# Patient Record
Sex: Female | Born: 1995 | Race: White | Hispanic: No | Marital: Married | Smoking: Current some day smoker
Health system: Southern US, Community
[De-identification: ages and names within clinical notes are randomized; demographics above are authoritative.]

## PROBLEM LIST (undated history)

## (undated) DIAGNOSIS — N63 Unspecified lump in unspecified breast: Secondary | ICD-10-CM

## (undated) DIAGNOSIS — N61 Mastitis without abscess: Secondary | ICD-10-CM

## (undated) DIAGNOSIS — J45909 Unspecified asthma, uncomplicated: Secondary | ICD-10-CM

## (undated) HISTORY — DX: Mastitis without abscess: N61.0

## (undated) HISTORY — PX: TONSILLECTOMY: SUR1361

## (undated) HISTORY — PX: UPPER GI ENDOSCOPY: SHX6162

## (undated) HISTORY — PX: GASTRIC ROUX-EN-Y: SHX5262

---
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
## 2013-07-18 DIAGNOSIS — N61 Mastitis without abscess: Secondary | ICD-10-CM

## 2013-07-18 HISTORY — DX: Mastitis without abscess: N61.0

## 2016-02-29 ENCOUNTER — Encounter: Payer: Self-pay | Admitting: *Deleted

## 2016-02-29 ENCOUNTER — Encounter (INDEPENDENT_AMBULATORY_CARE_PROVIDER_SITE_OTHER): Payer: Self-pay

## 2016-02-29 ENCOUNTER — Ambulatory Visit: Payer: Self-pay | Attending: Internal Medicine | Admitting: *Deleted

## 2016-02-29 VITALS — BP 124/79 | HR 69 | Temp 98.0°F | Ht 69.29 in | Wt 280.5 lb

## 2016-02-29 DIAGNOSIS — N63 Unspecified lump in unspecified breast: Secondary | ICD-10-CM

## 2016-02-29 NOTE — Progress Notes (Signed)
Subjective:     Patient ID: Sarah Noble, female   DOB: 12/03/1995, 20 y.o.   MRN: [id number]  HPI   Review of Systems     Objective:   Physical Exam  Pulmonary/Chest: Right breast exhibits no inverted nipple, no mass, no nipple discharge, no skin change and no tenderness. Left breast exhibits no inverted nipple, no mass, no nipple discharge and no tenderness.         Assessment:     20 year old White female referred to [facility] by Elane FritzLakasha Carter, FNP for a left breast mass at 9:00.  On clinical breast exam today bilateral breast have diffuse scattered fibroglandular like tissue.  There is no dominant mass, skin changes or lymphadenopathy noted.  The patient does have a family history of breast cancer in her paternal grandmother diagnosed in her 5920's.  Taught self breast awareness.  Patient has been screened for eligibility.  She does not have any insurance, Medicare or Medicaid.  She also meets financial eligibility.  Hand-out given on the Affordable Care Act.    Plan:     Will get ultrasound of the left breast.  Will follow-up per [facility] protocol.

## 2016-02-29 NOTE — Patient Instructions (Signed)
Gave patient hand-out, Women Staying Healthy, Active and Well from [facility], with education on breast health, pap smears, heart and colon health. 

## 2016-03-02 ENCOUNTER — Ambulatory Visit
Admission: RE | Admit: 2016-03-02 | Discharge: 2016-03-02 | Disposition: A | Discharge status: Home / Self Care | Payer: Self-pay | Source: Ambulatory Visit | Attending: Oncology | Admitting: Oncology

## 2016-03-02 DIAGNOSIS — N63 Unspecified lump in unspecified breast: Secondary | ICD-10-CM

## 2016-03-02 HISTORY — DX: Unspecified lump in unspecified breast: N63.0

## 2016-03-07 ENCOUNTER — Encounter: Payer: Self-pay | Admitting: *Deleted

## 2016-03-07 NOTE — Progress Notes (Signed)
Received results of patients ultrasound showing benign findings.  Radiologist had discussed the results with her earlier.  Left patient a message to return my call.  We will have her return at age 20 for screening mammogram unless otherwise indicated by clinical exam.  Notes routed to her PCP.  [facility] to [place].

## 2016-08-18 ENCOUNTER — Encounter: Payer: Self-pay | Admitting: *Deleted

## 2016-08-24 ENCOUNTER — Ambulatory Visit (INDEPENDENT_AMBULATORY_CARE_PROVIDER_SITE_OTHER): Payer: PRIVATE HEALTH INSURANCE | Admitting: General Surgery

## 2016-08-24 ENCOUNTER — Encounter: Payer: Self-pay | Admitting: General Surgery

## 2016-08-24 VITALS — BP 120/80 | HR 92 | Resp 14 | Ht 67.0 in | Wt 319.0 lb

## 2016-08-24 DIAGNOSIS — N644 Mastodynia: Secondary | ICD-10-CM | POA: Diagnosis not present

## 2016-08-24 NOTE — Patient Instructions (Signed)
The patient is aware to call back for any questions or concerns.  

## 2016-08-24 NOTE — Progress Notes (Signed)
Patient ID: Sarah Noble, female   DOB: 07/24/1995, 21 y.o.   MRN: [id number]  Chief Complaint  Patient presents with  . Breast Problem    HPI Sarah Noble is a 21 y.o. female.  who presents for a breast evaluation. She stats she is having itching of the left breast. She states the breast feels "heavy". She states she has noticed this for 2 weeks. She did notice a red spot but it is not as bad as it was. Noted that in August she had a mass in left breast.  History of right breast mastitis (2015).  I have reviewed the history of present illness with the patient.  HPI  Past Medical History:  Diagnosis Date  . Breast mass    palpable 9:00 Lt breast  . Mastitis 2015   right breast    Past Surgical History:  Procedure Laterality Date  . ACHILLES TENDON SURGERY  2012    Family History  Problem Relation Age of Onset  . Breast cancer Paternal Grandmother   . Breast cancer Maternal Grandmother     Social History Social History  Substance Use Topics  . Smoking status: Never Smoker  . Smokeless tobacco: Never Used  . Alcohol use Yes     Comment: occasionally    No Known Allergies  No current outpatient prescriptions on file.   No current facility-administered medications for this visit.     Review of Systems Review of Systems  Constitutional: Negative.   Respiratory: Negative.   Cardiovascular: Negative.     Blood pressure 120/80, pulse 92, resp. rate 14, height 5\' 7"  (1.702 m), weight (!) 319 lb (144.7 kg), last menstrual period 08/18/2016.  Physical Exam Physical Exam  Constitutional: She is oriented to person, place, and time. She appears well-developed and well-nourished.  HENT:  Mouth/Throat: Oropharynx is clear and moist.  Eyes: Conjunctivae are normal. No scleral icterus.  Neck: Neck supple.  Pulmonary/Chest: Right breast exhibits no inverted nipple, no mass, no nipple discharge, no skin change and no tenderness. Left breast exhibits no inverted  nipple, no mass, no nipple discharge, no skin change and no tenderness.  Lymphadenopathy:    She has no cervical adenopathy.    She has no axillary adenopathy.  Neurological: She is alert and oriented to person, place, and time.  Skin: Skin is warm and dry.  Psychiatric: Her behavior is normal.  Left breast shows a few faint reddish spots above and lateral to areola. No skin induration or underlying mass.  Data Reviewed Notes from [facility]  Assessment    Likely minor skin irritation on left breast-this appears to be markedly resolved at present. No cause for concern.     Plan   Pt reassured. Advised to call if there is any recurrence of symptoms The patient is aware to call back for any questions or concerns.      This information has been scribed by Dorathy DaftMarsha Hatch RN, BSN,BC.   Sarah Noble 08/25/2016, 8:16 AM

## 2016-10-03 ENCOUNTER — Ambulatory Visit
Admission: EM | Admit: 2016-10-03 | Discharge: 2016-10-03 | Disposition: A | Discharge status: Home / Self Care | Payer: Self-pay | Attending: Family Medicine | Admitting: Family Medicine

## 2016-10-03 ENCOUNTER — Encounter: Payer: Self-pay | Admitting: Emergency Medicine

## 2016-10-03 DIAGNOSIS — R140 Abdominal distension (gaseous): Secondary | ICD-10-CM

## 2016-10-03 NOTE — ED Triage Notes (Signed)
Patient c/o bloating after eating and abdominal pain for the past 6 months.  Patient denies vomiting.

## 2016-10-03 NOTE — Discharge Instructions (Signed)
Recommend evaluation by gastroenterologist

## 2016-10-06 ENCOUNTER — Ambulatory Visit
Admission: EM | Admit: 2016-10-06 | Discharge: 2016-10-06 | Disposition: A | Discharge status: Home / Self Care | Payer: Self-pay | Attending: Family Medicine | Admitting: Family Medicine

## 2016-10-06 DIAGNOSIS — J301 Allergic rhinitis due to pollen: Secondary | ICD-10-CM

## 2016-10-06 DIAGNOSIS — J029 Acute pharyngitis, unspecified: Secondary | ICD-10-CM

## 2016-10-06 DIAGNOSIS — H109 Unspecified conjunctivitis: Secondary | ICD-10-CM

## 2016-10-06 LAB — RAPID STREP SCREEN (MED CTR MEBANE ONLY): Streptococcus, Group A Screen (Direct): NEGATIVE

## 2016-10-06 MED ORDER — ERYTHROMYCIN 5 MG/GM OP OINT: 1.0000 "application " | TOPICAL_OINTMENT | Freq: Four times a day (QID) | OPHTHALMIC | 0 refills | Status: DC

## 2016-10-06 NOTE — Discharge Instructions (Signed)
Take medication as prescribed. Rest. Drink plenty of fluids. Good hand hygiene.  ° °Follow up with your primary care physician this week as needed. Return to Urgent care for new or worsening concerns.  ° °

## 2016-10-06 NOTE — ED Triage Notes (Signed)
Patient complains of bilateral eye pain and swelling in the corner started yesterday morning. Patient states that she has a sore throat that started last night.

## 2016-10-06 NOTE — ED Provider Notes (Signed)
[facility] ____________________________________________  Time seen: Approximately 3:04 PM  I have reviewed the triage vital signs and the nursing notes.   HISTORY  Chief Complaint Eye Pain (bilateral) and Sore Throat   HPI Sarah Noble is a 21 y.o. female presenting for the complaints of 2 days of bilateral eye redness, irritation with some greenish drainage. Also reports last night she began to have some sore throat and nasal congestion. States rare cough. Denies fevers. Reports continues to eat and drink well. States some watery eyes as well as sneezing sensation. States both eyes are itchy. States she has been intermittently rubbing her eyes but they are itchy. Denies eye pain. Reports some history of seasonal allergies. Reports continues to remain active. States some sick contacts at work. Denies any pain at this time. Denies eye trauma, foreign bodies, foreign body sensation. Denies chemical exposure. Denies pinkeye exposure. Patient reports that she did try a new mascara this past weekend and unsure if that was a trigger as there is a few days delay of complaints. States has stopped using a new mascara this past Sunday.  Denies chest pain, shortness of breath, abdominal pain, dysuria, extremity pain, extremity swelling or rash. Denies recent sickness. Denies recent antibiotic use.   Patient's last menstrual period was 09/14/2016. Denies pregnancy   Past Medical History:  Diagnosis Date  . Breast mass    palpable 9:00 Lt breast  . Mastitis 2015   right breast    There are no active problems to display for this patient.   Past Surgical History:  Procedure Laterality Date  . ACHILLES TENDON SURGERY  2012     No current facility-administered medications for this encounter.   Current Outpatient Prescriptions:  .  erythromycin ophthalmic ointment, Place 1 application into both eyes 4 (four) times daily. For seven days, Disp: 3.5 g, Rfl:  0  Allergies Patient has no known allergies.  Family History  Problem Relation Age of Onset  . Breast cancer Paternal Grandmother   . Breast cancer Maternal Grandmother     Social History Social History  Substance Use Topics  . Smoking status: Never Smoker  . Smokeless tobacco: Never Used  . Alcohol use Yes     Comment: occasionally    Review of Systems Constitutional: No fever/chills Eyes: No visual changes.As above ENT: As above. Cardiovascular: Denies chest pain. Respiratory: Denies shortness of breath. Gastrointestinal: No abdominal pain.  No nausea, no vomiting.  No diarrhea.  No constipation. Genitourinary: Negative for dysuria. Musculoskeletal: Negative for back pain. Skin: Negative for rash. Neurological: Negative for headaches, focal weakness or numbness.  10-point ROS otherwise negative.  ____________________________________________   PHYSICAL EXAM:  VITAL SIGNS: ED Triage Vitals  Enc Vitals Group     BP 10/06/16 1413 132/84     Pulse Rate 10/06/16 1413 98     Resp 10/06/16 1413 18     Temp 10/06/16 1413 97.5 F (36.4 C)     Temp Source 10/06/16 1413 Oral     SpO2 10/06/16 1413 100 %     Weight 10/06/16 1412 280 lb (127 kg)     Height 10/06/16 1412 5\' 7"  (1.702 m)     Head Circumference --      Peak Flow --      Pain Score 10/06/16 1413 6     Pain Loc --      Pain Edu? --      Excl. in GC? --      Visual Acuity  Right Eye Distance: 20/25 (uncorrected) Left Eye Distance: 20/25 (uncorrected) Bilateral Distance: 20/20 (uncorrected)   Constitutional: Alert and oriented. Well appearing and in no acute distress. Eyes: Minimal injection bilateral conjunctivae. No foreign body seen bilaterally. Right eye with scant greenish discharge. Bilateral eyes medial lower eyelid margin mild erythema present, no surrounding erythema, nontender, no fluctuance or induration. PERRL. EOMI. Head: Atraumatic. No sinus tenderness to palpation. No swelling. No  erythema.  Ears: no erythema, normal TMs bilaterally.   Nose:Nasal congestion with clear rhinorrhea  Mouth/Throat: Mucous membranes are moist. No pharyngeal erythema. No tonsillar swelling or exudate. Posterior pharyngeal cobblestoning noted. Neck: No stridor.  No cervical spine tenderness to palpation. Hematological/Lymphatic/Immunilogical: No cervical lymphadenopathy. Cardiovascular: Normal rate, regular rhythm. Grossly normal heart sounds.  Good peripheral circulation. Respiratory: Normal respiratory effort.  No retractions. No wheezes, rales or rhonchi. Good air movement.  Gastrointestinal: Soft and nontender.  Musculoskeletal: Ambulatory with steady gait. No cervical, thoracic or lumbar tenderness to palpation. Neurologic:  Normal speech and language. No gait instability. Skin:  Skin appears warm, dry and intact. No rash noted. Psychiatric: Mood and affect are normal. Speech and behavior are normal.  ___________________________________________   LABS (all labs ordered are listed, but only abnormal results are displayed)  Labs Reviewed  RAPID STREP SCREEN (NOT AT [facility])  CULTURE, GROUP A STREP [facility])     PROCEDURES Procedures     INITIAL IMPRESSION / ASSESSMENT AND PLAN / ED COURSE  Pertinent labs & imaging results that were available during my care of the patient were reviewed by me and considered in my medical decision making (see chart for details).  Well-appearing patient. No acute distress. Quick strep negative, will culture. Denies eye pain or visual changes or trauma. Suspect allergy symptoms, and discussed with patient viral versus allergic versus bacterial conjunctivitis. As some erythema noted with report of bilateral discharge, patient reports she is recently touching the area will start patient on erythromycin ophthalmic ointment. Encouraged good hand hygiene, warm compresses, keeping clean. Also recommended over-the-counter antihistamine. Encourage rest, fluids and  supportive care. Discussed strict follow-up and return parameters.  Discussed follow up with Primary care physician this week. Discussed follow up and return parameters including no resolution or any worsening concerns. Patient verbalized understanding and agreed to plan.   ____________________________________________   FINAL CLINICAL IMPRESSION(S) / ED DIAGNOSES  Final diagnoses:  Conjunctivitis of both eyes, unspecified conjunctivitis type  Hay fever     Discharge Medication List as of 10/06/2016  3:23 PM    START taking these medications   Details  erythromycin ophthalmic ointment Place 1 application into both eyes 4 (four) times daily. For seven days, Starting Thu 10/06/2016, Normal        Note: This dictation was prepared with Dragon dictation along with smaller phrase technology. Any transcriptional errors that result from this process are unintentional.        Renford Dills, NP 10/06/16 1610

## 2016-10-09 LAB — CULTURE, GROUP A STREP (THRC)

## 2016-11-01 ENCOUNTER — Ambulatory Visit: Payer: Self-pay | Admitting: Gastroenterology

## 2016-11-01 ENCOUNTER — Encounter: Payer: Self-pay | Admitting: Gastroenterology

## 2016-12-26 NOTE — ED Provider Notes (Signed)
[facility]    CSN: [id number] Arrival date & time: 10/03/16  1909     History   Chief Complaint Chief Complaint  Patient presents with  . Abdominal Pain    HPI Sarah Noble is a 21 y.o. female.   The history is provided by the patient.  Abdominal Pain  Pain location:  Generalized Pain quality: bloating   Pain radiates to:  Does not radiate Duration:  24 weeks Timing:  Intermittent Progression:  Unchanged Context: eating   Context: not alcohol use, not awakening from sleep, not diet changes, not laxative use, not medication withdrawal, not previous surgeries, not recent illness, not recent sexual activity, not recent travel, not retching, not sick contacts, not suspicious food intake and not trauma   Context comment:  After eating Relieved by:  None tried Ineffective treatments:  None tried Associated symptoms: sore throat   Associated symptoms: no anorexia, no belching, no chest pain, no chills, no constipation, no diarrhea, no dysuria, no fatigue, no fever, no flatus, no hematemesis, no hematochezia, no hematuria, no melena, no nausea, no shortness of breath, no vaginal bleeding, no vaginal discharge and no vomiting   Risk factors: obesity   Risk factors: no alcohol abuse, no aspirin use, not elderly, has not had multiple surgeries, no NSAID use, not pregnant and no recent hospitalization     Past Medical History:  Diagnosis Date  . Breast mass    palpable 9:00 Lt breast  . Mastitis 2015   right breast    There are no active problems to display for this patient.   Past Surgical History:  Procedure Laterality Date  . ACHILLES TENDON SURGERY  2012    OB History    No data available       Home Medications    Prior to Admission medications   Medication Sig Start Date End Date Taking? Authorizing Provider  erythromycin ophthalmic ointment Place 1 application into both eyes 4 (four) times daily. For seven days 10/06/16   Renford Dills, NP      Family History Family History  Problem Relation Age of Onset  . Breast cancer Paternal Grandmother   . Breast cancer Maternal Grandmother     Social History Social History  Substance Use Topics  . Smoking status: Never Smoker  . Smokeless tobacco: Never Used  . Alcohol use Yes     Comment: occasionally     Allergies   Patient has no known allergies.   Review of Systems Review of Systems  Constitutional: Negative for chills, fatigue and fever.  HENT: Positive for sore throat.   Respiratory: Negative for shortness of breath.   Cardiovascular: Negative for chest pain.  Gastrointestinal: Positive for abdominal pain. Negative for anorexia, constipation, diarrhea, flatus, hematemesis, hematochezia, melena, nausea and vomiting.  Genitourinary: Negative for dysuria, hematuria, vaginal bleeding and vaginal discharge.     Physical Exam Triage Vital Signs ED Triage Vitals  Enc Vitals Group     BP 10/03/16 1947 136/66     Pulse Rate 10/03/16 1947 84     Resp 10/03/16 1947 16     Temp 10/03/16 1947 98.3 F (36.8 C)     Temp Source 10/03/16 1947 Oral     SpO2 10/03/16 1947 98 %     Weight 10/03/16 1945 280 lb (127 kg)     Height 10/03/16 1945 5\' 7"  (1.702 m)     Head Circumference --      Peak Flow --  Pain Score 10/03/16 1946 4     Pain Loc --      Pain Edu? --      Excl. in GC? --    No data found.   Updated Vital Signs BP 136/66 (BP Location: Right Arm)   Pulse 84   Temp 98.3 F (36.8 C) (Oral)   Resp 16   Ht 5\' 7"  (1.702 m)   Wt 280 lb (127 kg)   LMP 09/14/2016 (Approximate)   SpO2 98%   BMI 43.85 kg/m   Visual Acuity Right Eye Distance:   Left Eye Distance:   Bilateral Distance:    Right Eye Near:   Left Eye Near:    Bilateral Near:     Physical Exam  Constitutional: She appears well-developed and well-nourished. No distress.  Abdominal: Soft. Bowel sounds are normal. She exhibits no distension and no mass. There is no tenderness. There  is no rebound and no guarding.  Skin: She is not diaphoretic.  Nursing note and vitals reviewed.    UC Treatments / Results  Labs (all labs ordered are listed, but only abnormal results are displayed) Labs Reviewed - No data to display  EKG  EKG Interpretation None       Radiology No results found.  Procedures Procedures (including critical care time)  Medications Ordered in UC Medications - No data to display   Initial Impression / Assessment and Plan / UC Course  I have reviewed the triage vital signs and the nursing notes.  Pertinent labs & imaging results that were available during my care of the patient were reviewed by me and considered in my medical decision making (see chart for details).       Final Clinical Impressions(s) / UC Diagnoses   Final diagnoses:  Postprandial abdominal bloating    New Prescriptions There are no discharge medications for this patient.  1.diagnosis reviewed with patient 2. Recommend supportive treatment with increase fluids 4. Follow-up with pcp for further evaluation or prn if symptoms worsen    Payton Mccallumonty, Jaxton Casale, MD 12/26/16 2047

## 2017-05-29 ENCOUNTER — Encounter: Payer: Self-pay | Admitting: Emergency Medicine

## 2017-05-29 ENCOUNTER — Ambulatory Visit
Admission: EM | Admit: 2017-05-29 | Discharge: 2017-05-29 | Disposition: A | Discharge status: Home / Self Care | Payer: Self-pay | Attending: Family Medicine | Admitting: Family Medicine

## 2017-05-29 ENCOUNTER — Other Ambulatory Visit: Payer: Self-pay

## 2017-05-29 DIAGNOSIS — J45901 Unspecified asthma with (acute) exacerbation: Secondary | ICD-10-CM

## 2017-05-29 DIAGNOSIS — J0100 Acute maxillary sinusitis, unspecified: Secondary | ICD-10-CM

## 2017-05-29 DIAGNOSIS — J029 Acute pharyngitis, unspecified: Secondary | ICD-10-CM

## 2017-05-29 HISTORY — DX: Unspecified asthma, uncomplicated: J45.909

## 2017-05-29 LAB — RAPID STREP SCREEN (MED CTR MEBANE ONLY): Streptococcus, Group A Screen (Direct): NEGATIVE

## 2017-05-29 MED ORDER — ALBUTEROL SULFATE HFA 108 (90 BASE) MCG/ACT IN AERS: 2.0000 | INHALATION_SPRAY | RESPIRATORY_TRACT | 0 refills | Status: AC | PRN

## 2017-05-29 MED ORDER — PREDNISONE 20 MG PO TABS: 40.0000 mg | ORAL_TABLET | Freq: Every day | ORAL | 0 refills | Status: DC

## 2017-05-29 MED ORDER — AMOXICILLIN-POT CLAVULANATE 875-125 MG PO TABS: 1.0000 | ORAL_TABLET | Freq: Two times a day (BID) | ORAL | 0 refills | Status: DC

## 2017-05-29 NOTE — ED Provider Notes (Signed)
[facility] ____________________________________________  Time seen: Approximately 9:50 AM  I have reviewed the triage vital signs and the nursing notes.   HISTORY  Chief Complaint Sore Throat   HPI Sarah Noble is a 21 y.o. female presenting for evaluation of approximately 8 days of runny nose, nasal congestion, sore throat, cough and intermittent wheezing.  Patient reports history of asthma.  States running out of albuterol inhaler.  States symptoms unresolved with over-the-counter Sudafed, cough and congestion agents.  Denies any fevers.  Reports multiple sick contacts at home and at work.  Reports is continues remain active.  States sinus pain and pressure currently mild to moderate.  States thick nasal drainage.  Reports continues to eat and drink well. Denies chest pain, shortness of breath, or rash. Denies recent sickness. Denies recent antibiotic use.   Patient's last menstrual period was 05/02/2017 (approximate).Denies pregnancy.   Past Medical History:  Diagnosis Date  . Asthma   . Breast mass    palpable 9:00 Lt breast  . Mastitis 2015   right breast    There are no active problems to display for this patient.   Past Surgical History:  Procedure Laterality Date  . ACHILLES TENDON SURGERY  2012  . TONSILLECTOMY       No current facility-administered medications for this encounter.   Current Outpatient Medications:  .  norgestimate-ethinyl estradiol (ORTHO-CYCLEN, 28,) 0.25-35 MG-MCG tablet, Take 1 tablet daily by mouth., Disp: , Rfl:  .  albuterol (PROVENTIL HFA;VENTOLIN HFA) 108 (90 Base) MCG/ACT inhaler, Inhale 2 puffs every 4 (four) hours as needed into the lungs., Disp: 1 Inhaler, Rfl: 0 .  amoxicillin-clavulanate (AUGMENTIN) 875-125 MG tablet, Take 1 tablet every 12 (twelve) hours by mouth., Disp: 20 tablet, Rfl: 0 .  predniSONE (DELTASONE) 20 MG tablet, Take 2 tablets (40 mg total) daily by mouth., Disp: 10 tablet, Rfl:  0  Allergies Patient has no known allergies.  Family History  Problem Relation Age of Onset  . Breast cancer Paternal Grandmother   . Breast cancer Maternal Grandmother     Social History Social History   Tobacco Use  . Smoking status: Never Smoker  . Smokeless tobacco: Never Used  Substance Use Topics  . Alcohol use: Yes    Comment: occasionally  . Drug use: No    Review of Systems Constitutional: No fever/chills ENT: Positive sore throat. Cardiovascular: Denies chest pain. Respiratory: Denies shortness of breath. Gastrointestinal: No abdominal pain.  No nausea, no vomiting.   Genitourinary: Negative for dysuria. Skin: Negative for rash. Neurological: Negative for focal weakness or numbness.  ____________________________________________   PHYSICAL EXAM:  VITAL SIGNS: ED Triage Vitals  Enc Vitals Group     BP 05/29/17 0925 (!) 122/58     Pulse Rate 05/29/17 0925 95     Resp 05/29/17 0925 16     Temp 05/29/17 0925 98.5 F (36.9 C)     Temp Source 05/29/17 0925 Oral     SpO2 05/29/17 0925 99 %     Weight 05/29/17 0922 280 lb (127 kg)     Height 05/29/17 0922 5\' 7"  (1.702 m)     Head Circumference --      Peak Flow --      Pain Score 05/29/17 0922 9     Pain Loc --      Pain Edu? --      Excl. in GC? --    Constitutional: Alert and oriented. Well appearing and in no acute distress. Eyes:  Conjunctivae are normal.  Head: Atraumatic. Mild to moderate tenderness to palpation bilateral frontal and maxillary sinuses. No swelling. No erythema.   Ears: no erythema, normal TMs bilaterally.   Nose: nasal congestion with bilateral nasal turbinate erythema and edema.   Mouth/Throat: Mucous membranes are moist.  Oropharynx non-erythematous.No tonsillar swelling or exudate.  Neck: No stridor.  No cervical spine tenderness to palpation. Hematological/Lymphatic/Immunilogical: No cervical lymphadenopathy. Cardiovascular: Normal rate, regular rhythm. Grossly normal heart  sounds.  Good peripheral circulation. Respiratory: Normal respiratory effort.  No retractions. No rales or rhonchi. Minimal scattered wheezing. Dry intermittent cough noted in room.  Speaks in complete sentences.  Good air movement.  Gastrointestinal: Soft and nontender. Musculoskeletal:  No cervical, thoracic or lumbar tenderness to palpation.  Neurologic:  Normal speech and language. No gait instability. Skin:  Skin is warm, dry. Psychiatric: Mood and affect are normal. Speech and behavior are normal.  ___________________________________________   LABS (all labs ordered are listed, but only abnormal results are displayed)  Labs Reviewed  RAPID STREP SCREEN (NOT AT [facility])  CULTURE, GROUP A STREP [facility]   ____________________________________________  PROCEDURES Procedures   INITIAL IMPRESSION / ASSESSMENT AND PLAN / ED COURSE  Pertinent labs & imaging results that were available during my care of the patient were reviewed by me and considered in my medical decision making (see chart for details).  Well appearing patient.  Suspect recent viral upper respiratory infection, concern for secondary sinusitis and asthma exacerbation.  Will treat patient with oral Augmentin, prednisone, albuterol.  Continue home over-the-counter Sudafed as needed.  Encourage rest, fluids and supportive care.Discussed indication, risks and benefits of medications with patient.  Discussed follow up with Primary care physician this week. Discussed follow up and return parameters including no resolution or any worsening concerns. Patient verbalized understanding and agreed to plan.   ____________________________________________   FINAL CLINICAL IMPRESSION(S) / ED DIAGNOSES  Final diagnoses:  Acute maxillary sinusitis, recurrence not specified  Asthma with acute exacerbation, unspecified asthma severity, unspecified whether persistent     ED Discharge Orders        Ordered    predniSONE (DELTASONE) 20  MG tablet  Daily     05/29/17 0946    amoxicillin-clavulanate (AUGMENTIN) 875-125 MG tablet  Every 12 hours     05/29/17 0946    albuterol (PROVENTIL HFA;VENTOLIN HFA) 108 (90 Base) MCG/ACT inhaler  Every 4 hours PRN     05/29/17 0946       Note: This dictation was prepared with Dragon dictation along with smaller phrase technology. Any transcriptional errors that result from this process are unintentional.         Renford DillsMiller, Turrell Severt, NP 05/29/17 [phone]

## 2017-05-29 NOTE — ED Triage Notes (Signed)
Patient c/o sore throat, swelling in her tonsils, sinus congestion and cough for over a week.

## 2017-05-29 NOTE — Discharge Instructions (Signed)
Take medication as prescribed. Rest. Drink plenty of fluids.  ° °Follow up with your primary care physician this week as needed. Return to Urgent care for new or worsening concerns.  ° °

## 2017-06-01 LAB — CULTURE, GROUP A STREP (THRC)

## 2018-09-09 ENCOUNTER — Ambulatory Visit
Admission: EM | Admit: 2018-09-09 | Discharge: 2018-09-09 | Disposition: A | Discharge status: Home / Self Care | Payer: 59 | Attending: Family Medicine | Admitting: Family Medicine

## 2018-09-09 ENCOUNTER — Other Ambulatory Visit: Payer: Self-pay

## 2018-09-09 DIAGNOSIS — L03011 Cellulitis of right finger: Secondary | ICD-10-CM | POA: Diagnosis not present

## 2018-09-09 MED ORDER — DOXYCYCLINE HYCLATE 100 MG PO CAPS: 100.0000 mg | ORAL_CAPSULE | Freq: Two times a day (BID) | ORAL | 0 refills | Status: DC

## 2018-09-09 MED ORDER — MUPIROCIN 2 % EX OINT: 1.0000 "application " | TOPICAL_OINTMENT | Freq: Two times a day (BID) | CUTANEOUS | 0 refills | Status: AC

## 2018-09-09 NOTE — Discharge Instructions (Signed)
Antibiotics as prescribed.  If you fail to improve or worsen, please seek reevaluation.  Take care  Dr. Adriana Simas

## 2018-09-09 NOTE — ED Triage Notes (Signed)
Patient was picking at her right middle finger nail yesterday. States that the entire finger into the hand is now hurting and swollen. States that there was white exudate coming out when she was squeezing it.

## 2018-09-11 NOTE — ED Provider Notes (Signed)
[facility]    CSN: [id number] Arrival date & time: 09/09/18  1158  History   Chief Complaint Chief Complaint  Patient presents with  . Finger Injury   HPI  23 year old female presents with the above complaint.  Patient has been biting her nails.  Patient reports that yesterday she was biting her nails.  Today she developed pain and redness as well as swelling of the right middle finger.  She reports drainage from the nail.  Pain is quite severe.  No reports of fever.  No medications or interventions tried.  No other associated symptoms.  No other complaints.  PMH, Surgical Hx, Family Hx, Social History reviewed and updated as below.  Past Medical History:  Diagnosis Date  . Asthma   . Breast mass    palpable 9:00 Lt breast  . Mastitis 2015   right breast   Past Surgical History:  Procedure Laterality Date  . ACHILLES TENDON SURGERY  2012  . TONSILLECTOMY     OB History   No obstetric history on file.    Home Medications    Prior to Admission medications   Medication Sig Start Date End Date Taking? Authorizing Provider  albuterol (PROVENTIL HFA;VENTOLIN HFA) 108 (90 Base) MCG/ACT inhaler Inhale 2 puffs every 4 (four) hours as needed into the lungs. 05/29/17  Yes Renford Dills, NP  doxycycline (VIBRAMYCIN) 100 MG capsule Take 1 capsule (100 mg total) by mouth 2 (two) times daily. 09/09/18   Tommie Sams, DO  mupirocin ointment (BACTROBAN) 2 % Apply 1 application topically 2 (two) times daily for 7 days. 09/09/18 09/16/18  Tommie Sams, DO    Family History Family History  Problem Relation Age of Onset  . Breast cancer Paternal Grandmother   . Breast cancer Maternal Grandmother     Social History Social History   Tobacco Use  . Smoking status: Never Smoker  . Smokeless tobacco: Never Used  Substance Use Topics  . Alcohol use: Yes    Comment: occasionally  . Drug use: No     Allergies   Patient has no known allergies.   Review of  Systems Review of Systems  Constitutional: Negative.   Skin:       Finger pain, swelling, redness.    Physical Exam Triage Vital Signs ED Triage Vitals  Enc Vitals Group     BP 09/09/18 1249 129/81     Pulse Rate 09/09/18 1249 75     Resp --      Temp 09/09/18 1249 97.6 F (36.4 C)     Temp Source 09/09/18 1249 Oral     SpO2 09/09/18 1249 98 %     Weight 09/09/18 1252 279 lb 15.8 oz (127 kg)     Height 09/09/18 1252 5\' 7"  (1.702 m)     Head Circumference --      Peak Flow --      Pain Score 09/09/18 1252 0     Pain Loc --      Pain Edu? --      Excl. in GC? --    Updated Vital Signs BP 129/81   Pulse 75   Temp 97.6 F (36.4 C) (Oral)   Ht 5\' 7"  (1.702 m)   Wt 127 kg   LMP 09/03/2018   SpO2 98%   BMI 43.85 kg/m   Visual Acuity Right Eye Distance:   Left Eye Distance:   Bilateral Distance:    Right Eye Near:   Left Eye  Near:    Bilateral Near:     Physical Exam Vitals signs and nursing note reviewed.  Constitutional:      General: She is not in acute distress.    Appearance: Normal appearance. She is obese.  HENT:     Head: Normocephalic and atraumatic.  Eyes:     General:        Right eye: No discharge.        Left eye: No discharge.     Conjunctiva/sclera: Conjunctivae normal.  Pulmonary:     Effort: Pulmonary effort is normal. No respiratory distress.  Skin:    Comments: Right middle finger -redness of the distal finger.  Tender to palpation.  No apparent felon.  Neurological:     Mental Status: She is alert.  Psychiatric:        Mood and Affect: Mood normal.        Behavior: Behavior normal.    UC Treatments / Results  Labs (all labs ordered are listed, but only abnormal results are displayed) Labs Reviewed - No data to display  EKG None  Radiology No results found.  Procedures Procedures (including critical care time)  Medications Ordered in UC Medications - No data to display  Initial Impression / Assessment and Plan / UC  Course  I have reviewed the triage vital signs and the nursing notes.  Pertinent labs & imaging results that were available during my care of the patient were reviewed by me and considered in my medical decision making (see chart for details).    23 year old female presents with cellulitis of the right middle finger.  Treating with Bactroban and doxycycline.  Final Clinical Impressions(s) / UC Diagnoses   Final diagnoses:  Cellulitis of right middle finger     Discharge Instructions     Antibiotics as prescribed.  If you fail to improve or worsen, please seek reevaluation.  Take care  Dr. Adriana Simas    ED Prescriptions    Medication Sig Dispense Auth. Provider   doxycycline (VIBRAMYCIN) 100 MG capsule Take 1 capsule (100 mg total) by mouth 2 (two) times daily. 14 capsule Yuya Vanwingerden G, DO   mupirocin ointment (BACTROBAN) 2 % Apply 1 application topically 2 (two) times daily for 7 days. 22 g Tommie Sams, DO     Controlled Substance Prescriptions [place] Controlled Substance Registry consulted? Not Applicable   Tommie Sams, [place] 09/11/18 [phone]

## 2019-02-10 ENCOUNTER — Encounter (HOSPITAL_COMMUNITY): Payer: Self-pay

## 2019-02-10 ENCOUNTER — Ambulatory Visit (HOSPITAL_COMMUNITY)
Admission: EM | Admit: 2019-02-10 | Discharge: 2019-02-10 | Disposition: A | Discharge status: Home / Self Care | Payer: 59

## 2019-02-10 ENCOUNTER — Other Ambulatory Visit: Payer: Self-pay

## 2019-02-10 DIAGNOSIS — S91209A Unspecified open wound of unspecified toe(s) with damage to nail, initial encounter: Secondary | ICD-10-CM

## 2019-02-10 NOTE — ED Triage Notes (Signed)
Pt states she was coming up some stairs and her foot got caught on a step and lifted her toe nail almost all of the way off. Pt states this happened a hour ago. Right foot big toe.

## 2019-02-10 NOTE — ED Provider Notes (Signed)
[facility]    CSN: [id number] Arrival date & time: 02/10/19  1726      History   Chief Complaint Chief Complaint  Patient presents with  . Nail Problem    HPI Sarah Noble is a 23 y.o. female.   23 year old female comes in for right great toenail injury. She was going up stairs earlier today and caught her foot on a step, causing an avulsed toenail. Denies bleeding. Denies numbness/tingling. Denies decrease in ROM. Rinsed toenail prior to arrival.      Past Medical History:  Diagnosis Date  . Asthma   . Breast mass    palpable 9:00 Lt breast  . Mastitis 2015   right breast    There are no active problems to display for this patient.   Past Surgical History:  Procedure Laterality Date  . ACHILLES TENDON SURGERY  2012  . TONSILLECTOMY      OB History   No obstetric history on file.      Home Medications    Prior to Admission medications   Medication Sig Start Date End Date Taking? Authorizing Provider  albuterol (PROVENTIL HFA;VENTOLIN HFA) 108 (90 Base) MCG/ACT inhaler Inhale 2 puffs every 4 (four) hours as needed into the lungs. 05/29/17   Renford DillsMiller, Lindsey, NP  doxycycline (VIBRAMYCIN) 100 MG capsule Take 1 capsule (100 mg total) by mouth 2 (two) times daily. 09/09/18   Tommie Samsook, Jayce G, DO    Family History Family History  Problem Relation Age of Onset  . Breast cancer Paternal Grandmother   . Breast cancer Maternal Grandmother     Social History Social History   Tobacco Use  . Smoking status: Never Smoker  . Smokeless tobacco: Never Used  Substance Use Topics  . Alcohol use: Yes    Comment: occasionally  . Drug use: No     Allergies   Patient has no known allergies.   Review of Systems Review of Systems  Reason unable to perform ROS: See HPI as above.     Physical Exam Triage Vital Signs ED Triage Vitals  Enc Vitals Group     BP 02/10/19 1742 (!) 148/84     Pulse Rate 02/10/19 1742 100     Resp 02/10/19 1742  18     Temp 02/10/19 1742 98.1 F (36.7 C)     Temp Source 02/10/19 1742 Oral     SpO2 02/10/19 1742 100 %     Weight 02/10/19 1739 (!) 339 lb (153.8 kg)     Height --      Head Circumference --      Peak Flow --      Pain Score 02/10/19 1739 2     Pain Loc --      Pain Edu? --      Excl. in GC? --    No data found.  Updated Vital Signs BP (!) 148/84 (BP Location: Right Arm)   Pulse 100   Temp 98.1 F (36.7 C) (Oral)   Resp 18   Wt (!) 339 lb (153.8 kg)   LMP 02/10/2019   SpO2 100%   BMI 53.09 kg/m   Visual Acuity Right Eye Distance:   Left Eye Distance:   Bilateral Distance:    Right Eye Near:   Left Eye Near:    Bilateral Near:     Physical Exam Constitutional:      General: She is not in acute distress.    Appearance: She is well-developed.  She is not diaphoretic.  HENT:     Head: Normocephalic and atraumatic.  Eyes:     Conjunctiva/sclera: Conjunctivae normal.     Pupils: Pupils are equal, round, and reactive to light.  Musculoskeletal:     Comments: Avulsed right great toenail. Proximal nailbed/cuticle intact. Sensation intact. Cap refill to the toe pad <2s.   Skin:    General: Skin is warm and dry.  Neurological:     Mental Status: She is alert and oriented to person, place, and time.      UC Treatments / Results  Labs (all labs ordered are listed, but only abnormal results are displayed) Labs Reviewed - No data to display  EKG   Radiology No results found.  Procedures  Digital block Consent given by patient Risks discussed. Area cleaned with alcohol. Block needle gauge: 27G Block anesthetic: lidocaine 2% without epi Block injection procedure: anatomic landmarks identified, introduced needle, incremental injection, anatomic landmarks palpated, negative aspiration for blood Block outcome: anesthesia achieved.   Procedures (including critical care time)  Medications Ordered in UC Medications - No data to display  Initial Impression  / Assessment and Plan / UC Course  I have reviewed the triage vital signs and the nursing notes.  Pertinent labs & imaging results that were available during my care of the patient were reviewed by me and considered in my medical decision making (see chart for details).    After anesthesia achieved, approximated nail back to nailbed. Wrapped with compression. Wound care instructions discussed. Follow up with podiatrist as needed.  Final Clinical Impressions(s) / UC Diagnoses   Final diagnoses:  Avulsion of toenail, initial encounter    ED Prescriptions    None        Ok Edwards, PA-C 02/10/19 2200

## 2019-02-10 NOTE — Discharge Instructions (Signed)
Continue to wrap toe nail in place and trim toe nail as needed. Follow up with podiatrist as needed.

## 2019-04-02 NOTE — Progress Notes (Signed)
[facility]  [address], [address] [place], [place] [address] Phone: [phone]  Fax: [phone]   Clinic Day:  04/04/2019  Referring physician: Emelda Brothers, *  Chief Complaint: Sarah Noble is a 24 y.o. female with iron deficiency who is referred in consultation by Dr. Emelda Brothers for assessment and management.   HPI: The patient was seen by general surgeon Dr. Andrey Farmer on 03/12/2019. She was diagnosed with iron deficiency.  She is being referred to the hematology clinic to ensure she is optimized prior to an upcoming gastric bypass surgery. Plan is for laparoscopic Roux-en-Y gastric bypass surgery.  Labs on 02/13/2019: hematocrit 39.7, hemoglobin 12.5, MCV 81, platelets 407,000, WBC 12,200. Ferritin was 7 with an iron saturation of 6% and a TIBC of 477.  CMP included a creatinine of 0.7, calcium 9.4, protein 8.4 (6.2-8.1), albumin 4.2, and normal LFTs.  TSH was 1.64.  B1 was 140.5 (normal).  B12 was 208 (low normal) and folic acid 5.9 (> 6.5).  CBC on 10/12/2016 included a hematocrit of 38.4, hemoglobin 12.6, MCV 82, platelets 6000, white count 11,000   Symptomatically, she is doing well. Patient feels extremely fatigued. She feels "sleepy" all the times. She denies sleep apnea. She reports nosebleeds during the summer. She has headaches. She denies any abdominal and urinary symptoms. She has normal bowel movements.    She has maintained a healthy diet since 10/2018. She drinks more water. She decreased her carbohydrate intake and increased the amount of green leafy vegtables. She is eating an iron rich diet.   She has heavy abnormal menstrual cycles. She does not use tampons. She uses extra long pads that she changes 4 times a day. She reports a lot of clotting. She has continuous periods that could last anywhere between 1 to 3 months. She has been on and off birth control x 3 years. She has noticed a mild improvement while on  birth control. She denies any hematuria, melena, or hematochezia.  She has ice pica.   She is on ferrous sulfate 325 mg every other day. She was advised to take oral B-12 1000 mcg daily, and folic acid 1 mg daily in the clinic today.   She has polycystic ovary disease. She recently noticed that she bruises and bleeds easily over the past few years. She had H. pylori in 2019.  She underwent an upper EDG on 02/06/2019. Her GI doctor is at the [facility] with Laurine Blazer, [place].   Past Medical History:  Diagnosis Date  . Asthma   . Breast mass    palpable 9:00 Lt breast  . Mastitis 2015   right breast    Past Surgical History:  Procedure Laterality Date  . ACHILLES TENDON SURGERY  2012  . TONSILLECTOMY    . UPPER GI ENDOSCOPY      Family History  Problem Relation Age of Onset  . Breast cancer Paternal Grandmother   . Breast cancer Maternal Grandmother   . Skin cancer Maternal Grandmother   . Iron deficiency Mother     Social History:  reports that she has never smoked. She has never used smokeless tobacco. She reports current alcohol use. She reports that she does not use drugs. She denies any exposure to radiation or toxins. She is an only child. She works for [employer]. She lives in [place].  The patient is alone today.  Allergies: No Known Allergies  Current Medications: Current Outpatient Medications  Medication Sig Dispense Refill  .  albuterol (PROVENTIL HFA;VENTOLIN HFA) 108 (90 Base) MCG/ACT inhaler Inhale 2 puffs every 4 (four) hours as needed into the lungs. 1 Inhaler 0  . ferrous sulfate 325 (65 FE) MG tablet Take 325 mg by mouth every other day.    . fluticasone (FLONASE) 50 MCG/ACT nasal spray Place 1 spray into both nostrils as needed.     . norgestimate-ethinyl estradiol (ORTHO-CYCLEN) 0.25-35 MG-MCG tablet Take 1 tablet by mouth daily.     . pantoprazole (PROTONIX) 40 MG tablet Take 40 mg by mouth as needed.     . Vitamin D,  Ergocalciferol, (DRISDOL) 1.25 MG (50000 UT) CAPS capsule Take 50,000 Units by mouth every 7 (seven) days.      No current facility-administered medications for this visit.     Review of Systems  Constitutional: Positive for malaise/fatigue. Negative for chills, diaphoresis, fever and weight loss.       Feels "fine".  HENT: Positive for nosebleeds (seasonal). Negative for congestion, ear discharge, ear pain, hearing loss, sinus pain, sore throat and tinnitus.   Eyes: Negative.  Negative for blurred vision and double vision.  Respiratory: Negative.  Negative for cough, hemoptysis, sputum production, shortness of breath and stridor.   Cardiovascular: Negative.  Negative for chest pain, palpitations and leg swelling.  Gastrointestinal: Negative for abdominal pain, blood in stool, constipation, diarrhea, melena, nausea and vomiting.       H. pylori in 2019. Upper EDG (02/06/2019). Normal bowels. Eating well.  Genitourinary: Negative for dysuria, frequency, hematuria and urgency.       PCOS. Heavy menstrual cycles. On birth control.  Musculoskeletal: Negative.  Negative for back pain, joint pain, myalgias and neck pain.  Skin: Negative.  Negative for itching and rash.  Neurological: Positive for headaches. Negative for dizziness, tingling, sensory change, speech change and weakness.  Endo/Heme/Allergies: Bruises/bleeds easily.  Psychiatric/Behavioral: Negative.  Negative for depression and memory loss. The patient is not nervous/anxious and does not have insomnia.   All other systems reviewed and are negative.  Performance status (ECOG): 0-1  Vitals Blood pressure 124/60, pulse 81, temperature 97.8 F (36.6 C), temperature source Oral, resp. rate 16, height 5\' 8"  (1.727 m), weight (!) 346 lb 2 oz (157 kg), SpO2 99 %.  Physical Exam  Constitutional: She is oriented to person, place, and time. She appears well-developed and well-nourished. No distress.  HENT:  Head: Normocephalic and  atraumatic.  Mouth/Throat: Oropharynx is clear and moist. No oropharyngeal exudate.  Long brown hair in a bun.  Eyes: Pupils are equal, round, and reactive to light. Conjunctivae and EOM are normal.  Green eyes.  Neck: Normal range of motion. Neck supple. No JVD present.  Cardiovascular: Normal rate, regular rhythm and normal heart sounds.  No murmur heard. Pulmonary/Chest: Effort normal and breath sounds normal. No respiratory distress. She has no wheezes. She has no rales.  Abdominal: Soft. Bowel sounds are normal. She exhibits no distension and no mass. There is no abdominal tenderness. There is no rebound and no guarding.  Fully round with no appreciable hepatosplenomegaly.  Musculoskeletal: Normal range of motion.        General: No tenderness or edema.  Lymphadenopathy:    She has no cervical adenopathy.    She has no axillary adenopathy.       Right: No supraclavicular adenopathy present.       Left: No supraclavicular adenopathy present.  Neurological: She is alert and oriented to person, place, and time.  Skin: Skin is warm and dry. No  rash noted. She is not diaphoretic. No erythema. No pallor.  Psychiatric: She has a normal mood and affect. Her behavior is normal. Judgment and thought content normal.  Nursing note and vitals reviewed.   No visits with results within 3 Day(s) from this visit.  Latest known visit with results is:  Admission on 05/29/2017, Discharged on 05/29/2017  Component Date Value Ref Range Status  . Streptococcus, Group A Screen (Dir* 05/29/2017 NEGATIVE  NEGATIVE Final   Comment: (NOTE) A Rapid Antigen test may result negative if the antigen level in the sample is below the detection level of this test. The FDA has not cleared this test as a stand-alone test therefore the rapid antigen negative result has reflexed to a Group A Strep culture.   Marland Kitchen. Specimen Description 05/29/2017 THROAT   Final  . Special Requests 05/29/2017 NONE Reflexed from [id number]    Final  . Culture 05/29/2017    Final                   Value:NO GROUP A STREP (S.PYOGENES) ISOLATED Performed at [facility], [address] [place], [place] [address]   . Report Status 05/29/2017 06/01/2017 FINAL   Final    Assessment:  Sarah Noble is a 23 y.o. female with iron deficiency.  She is being considered for gastric bypass surgery.  Diet has been good since 10/2018.  She notes heavy menses.  She denies any hematuria, melena or hematochezia.  Hemoglobin was 12.5 with a ferritin of 7, iron saturation of 6% and a TIBC of 477 on 02/13/2019.  She is taking oral iron QOD.  She has B12 and folate deficiency.  B12 was 208 (low normal) and folic acid 5.9 (> 6.5) on 02/13/2019.  She underwent EGD on 02/06/2019 (no report available).  She has a history of H pylori.  Symptomatically, she feels "fine".  She often feels sleepy.  She denies sleep apnea.  Plan: 1.   Labs today:  CBC with diff, retic, ferritin, anti-parietal antibodies, intrinsic factor antibodies.  2.   Urinalysis: r/o hematuria. 3.   Iron deficiency  Review labs from 02/13/2019.  She has iron deficiency (ferritin 7 and iron saturation 6%) with a normal hemoglobin.  Iron stores are low.  Etiology is felt secondary to heavy menses.  Discuss increasing oral iron from QOD to daily and possibly BID.  Encourage taking iron with OJ or vitamin C.  Review potential constipation with oral iron.  Preauth Venofer. 4.  B12 and folate deficiency  Review labs from 02/13/2019.  Discuss taking B12 1000 mcg a day and folic acid 1 mg a day.  Discuss rechecking levels in 1 month. 5.   Planned gastric bypass surgery  Discuss issues with iron and B12 absorption.  Discuss likely need for lifelong B12 injections as well as IV iron.  Information provided on IV iron (Venofer). 6.   RTC in 2 weeks for MD assessment (Doximity), labs (CBC, ferritin- day before) to assess tolerance of oral iron and discussion regarding decision  to pursue IV iron.   I discussed the assessment and treatment plan with the patient.  The patient was provided an opportunity to ask questions and all were answered.  The patient agreed with the plan and demonstrated an understanding of the instructions.  The patient was advised to call back if the symptoms worsen or if the condition fails to improve as anticipated.  I provided 27 minutes of face-to-face time during this this encounter  and > 50% was spent counseling as documented under my assessment and plan.    Melissa C. Merlene Pullingorcoran, MD, PhD    04/04/2019, 11:03 AM  I, Theador HawthorneAlexis Patterson, am acting as scribe for Melissa C. Merlene Pullingorcoran, MD, PhD.  I, Melissa C. Merlene Pullingorcoran, MD, have reviewed the above documentation for accuracy and completeness, and I agree with the above.

## 2019-04-03 ENCOUNTER — Other Ambulatory Visit: Payer: Self-pay

## 2019-04-04 ENCOUNTER — Encounter: Payer: Self-pay | Admitting: Hematology and Oncology

## 2019-04-04 ENCOUNTER — Inpatient Hospital Stay: Payer: 59

## 2019-04-04 ENCOUNTER — Inpatient Hospital Stay: Payer: 59 | Attending: Hematology and Oncology | Admitting: Hematology and Oncology

## 2019-04-04 VITALS — BP 124/60 | HR 81 | Temp 97.8°F | Resp 16 | Ht 68.0 in | Wt 346.1 lb

## 2019-04-04 DIAGNOSIS — N920 Excessive and frequent menstruation with regular cycle: Secondary | ICD-10-CM | POA: Diagnosis not present

## 2019-04-04 DIAGNOSIS — Z8619 Personal history of other infectious and parasitic diseases: Secondary | ICD-10-CM | POA: Diagnosis not present

## 2019-04-04 DIAGNOSIS — Z803 Family history of malignant neoplasm of breast: Secondary | ICD-10-CM | POA: Insufficient documentation

## 2019-04-04 DIAGNOSIS — R5383 Other fatigue: Secondary | ICD-10-CM | POA: Insufficient documentation

## 2019-04-04 DIAGNOSIS — E282 Polycystic ovarian syndrome: Secondary | ICD-10-CM | POA: Diagnosis not present

## 2019-04-04 DIAGNOSIS — R51 Headache: Secondary | ICD-10-CM | POA: Diagnosis not present

## 2019-04-04 DIAGNOSIS — Z79899 Other long term (current) drug therapy: Secondary | ICD-10-CM | POA: Insufficient documentation

## 2019-04-04 DIAGNOSIS — D500 Iron deficiency anemia secondary to blood loss (chronic): Secondary | ICD-10-CM

## 2019-04-04 DIAGNOSIS — E538 Deficiency of other specified B group vitamins: Secondary | ICD-10-CM | POA: Insufficient documentation

## 2019-04-04 DIAGNOSIS — Z808 Family history of malignant neoplasm of other organs or systems: Secondary | ICD-10-CM | POA: Insufficient documentation

## 2019-04-04 DIAGNOSIS — D509 Iron deficiency anemia, unspecified: Secondary | ICD-10-CM

## 2019-04-04 DIAGNOSIS — E611 Iron deficiency: Secondary | ICD-10-CM | POA: Insufficient documentation

## 2019-04-04 LAB — URINALYSIS, COMPLETE (UACMP) WITH MICROSCOPIC
Bilirubin Urine: NEGATIVE
Glucose, UA: NEGATIVE mg/dL
Hgb urine dipstick: NEGATIVE
Ketones, ur: NEGATIVE mg/dL
Leukocytes,Ua: NEGATIVE
Nitrite: NEGATIVE
Protein, ur: NEGATIVE mg/dL
RBC / HPF: NONE SEEN RBC/hpf (ref 0–5)
Specific Gravity, Urine: 1.02 (ref 1.005–1.030)
pH: 5.5 (ref 5.0–8.0)

## 2019-04-04 LAB — CBC WITH DIFFERENTIAL/PLATELET
Abs Immature Granulocytes: 0.03 10*3/uL (ref 0.00–0.07)
Basophils Absolute: 0 10*3/uL (ref 0.0–0.1)
Basophils Relative: 0 %
Eosinophils Absolute: 0.2 10*3/uL (ref 0.0–0.5)
Eosinophils Relative: 2 %
HCT: 36.7 % (ref 36.0–46.0)
Hemoglobin: 12 g/dL (ref 12.0–15.0)
Immature Granulocytes: 0 %
Lymphocytes Relative: 25 %
Lymphs Abs: 2.7 10*3/uL (ref 0.7–4.0)
MCH: 26.1 pg (ref 26.0–34.0)
MCHC: 32.7 g/dL (ref 30.0–36.0)
MCV: 79.8 fL — ABNORMAL LOW (ref 80.0–100.0)
Monocytes Absolute: 0.7 10*3/uL (ref 0.1–1.0)
Monocytes Relative: 6 %
Neutro Abs: 7 10*3/uL (ref 1.7–7.7)
Neutrophils Relative %: 67 %
Platelets: 352 10*3/uL (ref 150–400)
RBC: 4.6 MIL/uL (ref 3.87–5.11)
RDW: 15.8 % — ABNORMAL HIGH (ref 11.5–15.5)
WBC: 10.6 10*3/uL — ABNORMAL HIGH (ref 4.0–10.5)
nRBC: 0 % (ref 0.0–0.2)

## 2019-04-04 LAB — RETICULOCYTES
Immature Retic Fract: 21.5 % — ABNORMAL HIGH (ref 2.3–15.9)
RBC.: 4.76 MIL/uL (ref 3.87–5.11)
Retic Count, Absolute: 105.2 10*3/uL (ref 19.0–186.0)
Retic Ct Pct: 2.2 % (ref 0.4–3.1)

## 2019-04-04 LAB — FERRITIN: Ferritin: 11 ng/mL (ref 11–307)

## 2019-04-04 NOTE — Patient Instructions (Signed)
Take B12 1000 micrograms a day.  Take folic acid 1 mg a day.  Try to increase ferrous sulfate 1 pill a day then increase to 1 pill twice a day if tolerated.  Take with orange juice or vitamin C.   Iron Sucrose injection What is this medicine? IRON SUCROSE (AHY ern SOO krohs) is an iron complex. Iron is used to make healthy red blood cells, which carry oxygen and nutrients throughout the body. This medicine is used to treat iron deficiency anemia in people with chronic kidney disease. This medicine may be used for other purposes; ask your health care provider or pharmacist if you have questions. COMMON BRAND NAME(S): Venofer What should I tell my health care provider before I take this medicine? They need to know if you have any of these conditions:  anemia not caused by low iron levels  heart disease  high levels of iron in the blood  kidney disease  liver disease  an unusual or allergic reaction to iron, other medicines, foods, dyes, or preservatives  pregnant or trying to get pregnant  breast-feeding How should I use this medicine? This medicine is for infusion into a vein. It is given by a health care professional in a hospital or clinic setting. Talk to your pediatrician regarding the use of this medicine in children. While this drug may be prescribed for children as young as 2 years for selected conditions, precautions do apply. Overdosage: If you think you have taken too much of this medicine contact a poison control center or emergency room at once. NOTE: This medicine is only for you. Do not share this medicine with others. What if I miss a dose? It is important not to miss your dose. Call your doctor or health care professional if you are unable to keep an appointment. What may interact with this medicine? Do not take this medicine with any of the following medications:  deferoxamine  dimercaprol  other iron products This medicine may also interact with the  following medications:  chloramphenicol  deferasirox This list may not describe all possible interactions. Give your health care provider a list of all the medicines, herbs, non-prescription drugs, or dietary supplements you use. Also tell them if you smoke, drink alcohol, or use illegal drugs. Some items may interact with your medicine. What should I watch for while using this medicine? Visit your doctor or healthcare professional regularly. Tell your doctor or healthcare professional if your symptoms do not start to get better or if they get worse. You may need blood work done while you are taking this medicine. You may need to follow a special diet. Talk to your doctor. Foods that contain iron include: whole grains/cereals, dried fruits, beans, or peas, leafy green vegetables, and organ meats (liver, kidney). What side effects may I notice from receiving this medicine? Side effects that you should report to your doctor or health care professional as soon as possible:  allergic reactions like skin rash, itching or hives, swelling of the face, lips, or tongue  breathing problems  changes in blood pressure  cough  fast, irregular heartbeat  feeling faint or lightheaded, falls  fever or chills  flushing, sweating, or hot feelings  joint or muscle aches/pains  seizures  swelling of the ankles or feet  unusually weak or tired Side effects that usually do not require medical attention (report to your doctor or health care professional if they continue or are bothersome):  diarrhea  feeling achy  headache  irritation at site where injected  nausea, vomiting  stomach upset  tiredness This list may not describe all possible side effects. Call your doctor for medical advice about side effects. You may report side effects to FDA at 1-800-FDA-1088. Where should I keep my medicine? This drug is given in a hospital or clinic and will not be stored at home. NOTE: This sheet is  a summary. It may not cover all possible information. If you have questions about this medicine, talk to your doctor, pharmacist, or health care provider.  2020 Elsevier/Gold Standard (2011-04-14 17:14:35)

## 2019-04-04 NOTE — Progress Notes (Signed)
Pt here as new patient referral from Dr. Andrey Farmer for Iron deficiency. Patient states she is fatigued and reports her mother had issues with iron deficiency as well. States she is currently in the gastric bypass clinic with [facility].

## 2019-04-05 LAB — INTRINSIC FACTOR ANTIBODIES: Intrinsic Factor: 1 AU/mL (ref 0.0–1.1)

## 2019-04-08 LAB — ANTI-PARIETAL ANTIBODY: Parietal Cell Antibody-IgG: 1.9 Units (ref 0.0–20.0)

## 2019-04-11 ENCOUNTER — Encounter: Payer: Self-pay | Admitting: Hematology and Oncology

## 2019-04-16 ENCOUNTER — Other Ambulatory Visit: Payer: Self-pay

## 2019-04-16 DIAGNOSIS — D509 Iron deficiency anemia, unspecified: Secondary | ICD-10-CM

## 2019-04-16 NOTE — Progress Notes (Signed)
[facility]  [address], [address] [place], [place] [address] Phone: [phone]  Fax: [phone]   Telemedicine Office Visit:  04/18/2019  Referring physician: Orene Desanctis, MD  I connected with Sarah Noble on 04/18/2019 at 2:06 PM by videoconferencing and verified that I was speaking with the correct person using 2 identifiers.  The patient was at work.  I discussed the limitations, risk, security and privacy concerns of performing an evaluation and management service by videoconferencing and the availability of in person appointments.  I also discussed with the patient that there may be a patient responsible charge related to this service.  The patient expressed understanding and agreed to proceed.   Chief Complaint: Sarah Noble is a 23 y.o. female with iron deficiency who is seen for 2 week assessment and discussion regarding IV iron.   HPI: The patient was last seen in the hematology oncology clinic on 04/04/2019 for initial consultation.  She was being considered for gastric bypass surgery.  Diet has been good since 10/2018.  She described heavy menses. At that time she felt "fine". She often felt sleepy. She denied sleep apnea. She was on oral iron 325 mg every other day.   Work up included hematocrit 36.7, hemoglobin 12.0, MCV 79.8, platelets 352,000, WBC 10,600. Ferritin was 11. Retic was 2.2%. Anti-parietal cell antibody and intrinsic factor antibody were negative. Urinalysis revealed no hematuria.   She is scheduled for gastric bypass surgery on 05/13/2019.    Labs on 04/17/2019:  Hematocrit 38.6, hemoglobin 12.3, MCV 81.1, platelets 340,000, WBC 10,000. Ferritin was 13.  During the interim, she has done well. She has been taking oral iron 325 mg daily since 04/05/2019.  She notes constipation. She had no further complaints today.    Past Medical History:  Diagnosis Date  . Asthma   . Breast mass    palpable 9:00 Lt breast   . Mastitis 2015   right breast    Past Surgical History:  Procedure Laterality Date  . ACHILLES TENDON SURGERY  2012  . TONSILLECTOMY    . UPPER GI ENDOSCOPY      Family History  Problem Relation Age of Onset  . Breast cancer Paternal Grandmother   . Breast cancer Maternal Grandmother   . Skin cancer Maternal Grandmother   . Iron deficiency Mother     Social History:  reports that she has never smoked. She has never used smokeless tobacco. She reports current alcohol use. She reports that she does not use drugs. She denies any exposure to radiation or toxins. She is an only child. She works for [employer]. She lives in [place].  The patient is alone today.  Participants in the patient's visit and their role in the encounter included the patient, and Bronwen Betters, CMA, today.  The intake visit was provided by Bronwen Betters, CMA.  Allergies: No Known Allergies  Current Medications: Current Outpatient Medications  Medication Sig Dispense Refill  . ferrous sulfate 325 (65 FE) MG tablet Take 325 mg by mouth every other day.    . norgestimate-ethinyl estradiol (ORTHO-CYCLEN) 0.25-35 MG-MCG tablet Take 1 tablet by mouth daily.     . Vitamin D, Ergocalciferol, (DRISDOL) 1.25 MG (50000 UT) CAPS capsule Take 50,000 Units by mouth every 7 (seven) days.     Marland Kitchen albuterol (PROVENTIL HFA;VENTOLIN HFA) 108 (90 Base) MCG/ACT inhaler Inhale 2 puffs every 4 (four) hours as needed into the lungs. (Patient not taking: Reported on 04/18/2019) 1  Inhaler 0  . fluticasone (FLONASE) 50 MCG/ACT nasal spray Place 1 spray into both nostrils as needed.     . pantoprazole (PROTONIX) 40 MG tablet Take 40 mg by mouth as needed.      No current facility-administered medications for this visit.     Review of Systems  Constitutional: Negative for chills, diaphoresis, fever, malaise/fatigue and weight loss.       Feels good.  HENT: Negative.  Negative for congestion, ear discharge,  ear pain, hearing loss, nosebleeds (seasonal), sinus pain, sore throat and tinnitus.   Eyes: Negative.  Negative for blurred vision and double vision.  Respiratory: Negative.  Negative for cough, hemoptysis, sputum production, shortness of breath and stridor.   Cardiovascular: Negative.  Negative for chest pain, palpitations and leg swelling.  Gastrointestinal: Positive for constipation. Negative for abdominal pain, blood in stool, diarrhea, melena, nausea and vomiting.       H. pylori in 2019. Upper EGD on 02/06/2019.   Genitourinary: Negative for dysuria, frequency, hematuria and urgency.       PCOS. Heavy menstrual cycles on birth control.  Musculoskeletal: Negative.  Negative for back pain, joint pain, myalgias and neck pain.  Skin: Negative.  Negative for itching and rash.  Neurological: Negative.  Negative for dizziness, tingling, sensory change, speech change, weakness and headaches.  Endo/Heme/Allergies: Bruises/bleeds easily.  Psychiatric/Behavioral: Negative.  Negative for depression and memory loss. The patient is not nervous/anxious and does not have insomnia.   All other systems reviewed and are negative.   Performance status (ECOG): 0  Physical Exam  Constitutional: She is oriented to person, place, and time. She appears well-developed and well-nourished. No distress.  HENT:  Head: Normocephalic and atraumatic.  Brown hair.  Eyes: Conjunctivae and EOM are normal. No scleral icterus.  Green eyes.  Neurological: She is alert and oriented to person, place, and time.  Skin: She is not diaphoretic.  Psychiatric: She has a normal mood and affect. Her behavior is normal. Judgment and thought content normal.  Nursing note reviewed.   Appointment on 04/17/2019  Component Date Value Ref Range Status  . Ferritin 04/17/2019 13  11 - 307 ng/mL Final   Performed at [facility], [address] [place], [place] [id number]  . WBC 04/17/2019 10.0  4.0 - 10.5 K/uL Final  .  RBC 04/17/2019 4.76  3.87 - 5.11 MIL/uL Final  . Hemoglobin 04/17/2019 12.3  12.0 - 15.0 g/dL Final  . HCT 13/24/401009/30/2020 38.6  36.0 - 46.0 % Final  . MCV 04/17/2019 81.1  80.0 - 100.0 fL Final  . MCH 04/17/2019 25.8* 26.0 - 34.0 pg Final  . MCHC 04/17/2019 31.9  30.0 - 36.0 g/dL Final  . RDW 27/25/366409/30/2020 15.7* 11.5 - 15.5 % Final  . Platelets 04/17/2019 340  150 - 400 K/uL Final  . nRBC 04/17/2019 0.0  0.0 - 0.2 % Final  . Neutrophils Relative % 04/17/2019 62  % Final  . Neutro Abs 04/17/2019 6.1  1.7 - 7.7 K/uL Final  . Lymphocytes Relative 04/17/2019 27  % Final  . Lymphs Abs 04/17/2019 2.7  0.7 - 4.0 K/uL Final  . Monocytes Relative 04/17/2019 8  % Final  . Monocytes Absolute 04/17/2019 0.8  0.1 - 1.0 K/uL Final  . Eosinophils Relative 04/17/2019 3  % Final  . Eosinophils Absolute 04/17/2019 0.3  0.0 - 0.5 K/uL Final  . Basophils Relative 04/17/2019 0  % Final  . Basophils Absolute 04/17/2019 0.0  0.0 - 0.1 K/uL  Final  . Immature Granulocytes 04/17/2019 0  % Final  . Abs Immature Granulocytes 04/17/2019 0.03  0.00 - 0.07 K/uL Final   Performed at [facility], [address], [place], [place] [address]    Assessment:  Sarah Noble is a 23 y.o. female with iron deficiency.  She is being considered for gastric bypass surgery.  Diet has been good since 10/2018.  She notes heavy menses.  She denies any hematuria, melena or hematochezia.  Hemoglobin was 12.5 with a ferritin of 7, iron saturation of 6% and a TIBC of 477 on 02/13/2019.  She is taking oral iron QOD.  Work up on 04/04/2019 revealed hematocrit 36.7, hemoglobin 12.0, MCV 79.8, platelets 352,000, WBC 10,600. Ferritin was 11. Retic was 2.2%. Anti-parietal cell antibody and intrinsic factor antibody were negative. Urinalysis revealed no hematuria.   She has B12 and folate deficiency.  B12 was 208 (low normal) and folic acid 5.9 (> 6.5) on 02/13/2019.  She underwent EGD on 02/06/2019 (no report available).  She  has a history of H pylori.  Symptomatically, she is doing well.  Plan: 1.   Review labs from 04/17/2019.  2.   Iron deficiency             Hematocrit 38.6.  Hemoglobin 12.3.  MCV 81.1.  Ferritin 13.             Goal ferritin 100.  She is currently tolerating oral iron daily.             Review patient has iron deficiency but no anemia.    Re-review issues with iron absorption after gastric bypass surgery.   Review likely need for long-term IV iron.             Patient and her husband have agreed to proceed with gastric bypass surgery.  Preauth Venofer. 3.   B12 and folate deficiency             B12 was 208 and folate 5.9 on 02/03/2019.             Continue B12 1000 mcg a day and folic acid 1 mg a day.             Recheck levels after surgery. 4.    RTC on 06/18/2019 for MD assessment, labs (CBC with diff, ferritin, iron studies, B12, folate- day before), and +/- Venofer.  I discussed the assessment and treatment plan with the patient.  The patient was provided an opportunity to ask questions and all were answered.  The patient agreed with the plan and demonstrated an understanding of the instructions.  The patient was advised to call back or seek an in person evaluation if the symptoms worsen or if the condition fails to improve as anticipated.  I provided 10 minutes (2:06 PM - 2:15 PM) of face-to-face video visit time during this this encounter and > 50% was spent counseling as documented under my assessment and plan.  I provided these services from the [facility] office.   Nolon Stalls, MD, PhD  04/18/2019, 2:06 PM  I, Selena Batten, am acting as scribe for [employer]. Mike Gip, MD, PhD.  I, Melissa C. Mike Gip, MD, have reviewed the above documentation for accuracy and completeness, and I agree with the above.

## 2019-04-17 ENCOUNTER — Inpatient Hospital Stay: Payer: 59

## 2019-04-17 DIAGNOSIS — E611 Iron deficiency: Secondary | ICD-10-CM | POA: Diagnosis not present

## 2019-04-17 DIAGNOSIS — D509 Iron deficiency anemia, unspecified: Secondary | ICD-10-CM

## 2019-04-17 LAB — CBC WITH DIFFERENTIAL/PLATELET
Abs Immature Granulocytes: 0.03 10*3/uL (ref 0.00–0.07)
Basophils Absolute: 0 10*3/uL (ref 0.0–0.1)
Basophils Relative: 0 %
Eosinophils Absolute: 0.3 10*3/uL (ref 0.0–0.5)
Eosinophils Relative: 3 %
HCT: 38.6 % (ref 36.0–46.0)
Hemoglobin: 12.3 g/dL (ref 12.0–15.0)
Immature Granulocytes: 0 %
Lymphocytes Relative: 27 %
Lymphs Abs: 2.7 10*3/uL (ref 0.7–4.0)
MCH: 25.8 pg — ABNORMAL LOW (ref 26.0–34.0)
MCHC: 31.9 g/dL (ref 30.0–36.0)
MCV: 81.1 fL (ref 80.0–100.0)
Monocytes Absolute: 0.8 10*3/uL (ref 0.1–1.0)
Monocytes Relative: 8 %
Neutro Abs: 6.1 10*3/uL (ref 1.7–7.7)
Neutrophils Relative %: 62 %
Platelets: 340 10*3/uL (ref 150–400)
RBC: 4.76 MIL/uL (ref 3.87–5.11)
RDW: 15.7 % — ABNORMAL HIGH (ref 11.5–15.5)
WBC: 10 10*3/uL (ref 4.0–10.5)
nRBC: 0 % (ref 0.0–0.2)

## 2019-04-17 LAB — FERRITIN: Ferritin: 13 ng/mL (ref 11–307)

## 2019-04-18 ENCOUNTER — Ambulatory Visit: Payer: 59

## 2019-04-18 ENCOUNTER — Encounter: Payer: Self-pay | Admitting: Hematology and Oncology

## 2019-04-18 ENCOUNTER — Inpatient Hospital Stay: Payer: 59 | Attending: Hematology and Oncology | Admitting: Hematology and Oncology

## 2019-04-18 DIAGNOSIS — E538 Deficiency of other specified B group vitamins: Secondary | ICD-10-CM

## 2019-04-18 DIAGNOSIS — D509 Iron deficiency anemia, unspecified: Secondary | ICD-10-CM | POA: Diagnosis not present

## 2019-04-18 NOTE — Progress Notes (Signed)
No new changes noted today. The patient name and DOB has been verified by phone today. 

## 2019-06-12 ENCOUNTER — Other Ambulatory Visit: Payer: Self-pay

## 2019-06-12 ENCOUNTER — Inpatient Hospital Stay: Payer: 59 | Attending: Hematology and Oncology

## 2019-06-12 DIAGNOSIS — E538 Deficiency of other specified B group vitamins: Secondary | ICD-10-CM | POA: Diagnosis not present

## 2019-06-12 DIAGNOSIS — Z808 Family history of malignant neoplasm of other organs or systems: Secondary | ICD-10-CM | POA: Diagnosis not present

## 2019-06-12 DIAGNOSIS — Z8619 Personal history of other infectious and parasitic diseases: Secondary | ICD-10-CM | POA: Diagnosis not present

## 2019-06-12 DIAGNOSIS — N920 Excessive and frequent menstruation with regular cycle: Secondary | ICD-10-CM | POA: Insufficient documentation

## 2019-06-12 DIAGNOSIS — K5900 Constipation, unspecified: Secondary | ICD-10-CM | POA: Insufficient documentation

## 2019-06-12 DIAGNOSIS — Z79899 Other long term (current) drug therapy: Secondary | ICD-10-CM | POA: Diagnosis not present

## 2019-06-12 DIAGNOSIS — Z803 Family history of malignant neoplasm of breast: Secondary | ICD-10-CM | POA: Diagnosis not present

## 2019-06-12 DIAGNOSIS — Z832 Family history of diseases of the blood and blood-forming organs and certain disorders involving the immune mechanism: Secondary | ICD-10-CM | POA: Insufficient documentation

## 2019-06-12 DIAGNOSIS — E611 Iron deficiency: Secondary | ICD-10-CM | POA: Diagnosis not present

## 2019-06-12 DIAGNOSIS — R5383 Other fatigue: Secondary | ICD-10-CM | POA: Insufficient documentation

## 2019-06-12 DIAGNOSIS — D509 Iron deficiency anemia, unspecified: Secondary | ICD-10-CM

## 2019-06-12 DIAGNOSIS — Z9884 Bariatric surgery status: Secondary | ICD-10-CM | POA: Diagnosis not present

## 2019-06-12 LAB — CBC WITH DIFFERENTIAL/PLATELET
Abs Immature Granulocytes: 0.03 10*3/uL (ref 0.00–0.07)
Basophils Absolute: 0.1 10*3/uL (ref 0.0–0.1)
Basophils Relative: 0 %
Eosinophils Absolute: 0.2 10*3/uL (ref 0.0–0.5)
Eosinophils Relative: 2 %
HCT: 41.6 % (ref 36.0–46.0)
Hemoglobin: 13.4 g/dL (ref 12.0–15.0)
Immature Granulocytes: 0 %
Lymphocytes Relative: 28 %
Lymphs Abs: 3.4 10*3/uL (ref 0.7–4.0)
MCH: 26.9 pg (ref 26.0–34.0)
MCHC: 32.2 g/dL (ref 30.0–36.0)
MCV: 83.4 fL (ref 80.0–100.0)
Monocytes Absolute: 0.7 10*3/uL (ref 0.1–1.0)
Monocytes Relative: 6 %
Neutro Abs: 7.5 10*3/uL (ref 1.7–7.7)
Neutrophils Relative %: 64 %
Platelets: 384 10*3/uL (ref 150–400)
RBC: 4.99 MIL/uL (ref 3.87–5.11)
RDW: 14.9 % (ref 11.5–15.5)
WBC: 11.9 10*3/uL — ABNORMAL HIGH (ref 4.0–10.5)
nRBC: 0 % (ref 0.0–0.2)

## 2019-06-12 LAB — IRON AND TIBC
Iron: 80 ug/dL (ref 28–170)
Saturation Ratios: 21 % (ref 10.4–31.8)
TIBC: 385 ug/dL (ref 250–450)
UIBC: 305 ug/dL

## 2019-06-12 LAB — FOLATE: Folate: 25 ng/mL (ref >5.9)

## 2019-06-12 LAB — VITAMIN B12: Vitamin B-12: 1594 pg/mL — ABNORMAL HIGH (ref 180–914)

## 2019-06-12 LAB — FERRITIN: Ferritin: 20 ng/mL (ref 11–307)

## 2019-06-16 NOTE — Progress Notes (Signed)
[facility]  [address], [address] [place], [place] [address] Phone: [phone]  Fax: [phone]   Clinic Day:  06/17/2019  Referring physician: Orene DesanctisBehling, Karen, MD  Chief Complaint: Sarah HaroldHeather Griggs Noble is a 23 y.o. female with iron deficiency who is seen for 2 month assessment   HPI: The patient was last encounter with the hematology clinic on 04/18/2019 via telemedicine. At that time, she was doing well. Hematocrit was 38.6, hemoglobin 12.3, and MCV 81.1.  Ferritin was 13.  She was taking oral iron daily.   She continued oral B12 and folic acid.  We discussed her plans for gastric bypass surgery.  She is scheduled for laparoscopic gastric bypass on 07/01/2019.  CBC on 06/12/2019 revealed a hematocrit 41.6, hemoglobin 13.4, MCV 83.4, platelets 384,000, WBC 11900, and ANC 7500. Ferritin was 20 with an iron saturation of 21% and a TIBC of 385.  B12 was 1594 and folate 25.   During the interim, she takes oral iron every other day and is taking oral B-12 and folic acid.  Symptomatically, she feels exhausted all the time. She goes to bed earlier due to having to get up earlier. She denies sleep apnea.    Past Medical History:  Diagnosis Date  . Asthma   . Breast mass    palpable 9:00 Lt breast  . Mastitis 2015   right breast    Past Surgical History:  Procedure Laterality Date  . ACHILLES TENDON SURGERY  2012  . TONSILLECTOMY    . UPPER GI ENDOSCOPY      Family History  Problem Relation Age of Onset  . Breast cancer Paternal Grandmother   . Breast cancer Maternal Grandmother   . Skin cancer Maternal Grandmother   . Iron deficiency Mother     Social History:  reports that she has never smoked. She has never used smokeless tobacco. She reports current alcohol use. She reports that she does not use drugs. She denies any exposure to radiation or toxins. She is an only child.She works for [employer].She [place] in [place]. The  patient is alone today.  Allergies: No Known Allergies  Current Medications: Current Outpatient Medications  Medication Sig Dispense Refill  . albuterol (PROVENTIL HFA;VENTOLIN HFA) 108 (90 Base) MCG/ACT inhaler Inhale 2 puffs every 4 (four) hours as needed into the lungs. 1 Inhaler 0  . ferrous sulfate 325 (65 FE) MG tablet Take 325 mg by mouth every other day.    . norgestimate-ethinyl estradiol (ORTHO-CYCLEN) 0.25-35 MG-MCG tablet Take 1 tablet by mouth daily.     . pantoprazole (PROTONIX) 40 MG tablet Take 40 mg by mouth as needed.     . fluticasone (FLONASE) 50 MCG/ACT nasal spray Place 1 spray into both nostrils as needed.     . gabapentin (NEURONTIN) 300 MG capsule Take by mouth.    . ondansetron (ZOFRAN-ODT) 4 MG disintegrating tablet     . scopolamine (TRANSDERM-SCOP) 1 MG/3DAYS      No current facility-administered medications for this visit.     Review of Systems  Constitutional: Positive for weight loss (5 pounds since 04/04/2019). Negative for chills, diaphoresis, fever and malaise/fatigue.       Feels "good". Exhausted.  HENT: Negative for congestion, ear pain, hearing loss, sinus pain and sore throat.   Eyes: Negative.  Negative for blurred vision and double vision.  Respiratory: Negative.  Negative for cough, hemoptysis, sputum production, shortness of breath and stridor.   Cardiovascular: Negative.  Negative for  chest pain, palpitations and leg swelling.  Gastrointestinal: Positive for constipation. Negative for abdominal pain, blood in stool, diarrhea, melena, nausea and vomiting.       H. pylori in 2019.  Upper EGD on 02/06/2019.   Genitourinary: Negative for dysuria, frequency, hematuria and urgency.       PCOS. Heavy menstrual cycles on birth control.  Musculoskeletal: Negative.  Negative for back pain, joint pain, myalgias and neck pain.  Skin: Negative.  Negative for itching and rash.  Neurological: Negative.  Negative for dizziness, tingling, sensory change,  speech change, focal weakness, seizures, weakness and headaches.  Endo/Heme/Allergies: Bruises/bleeds easily.  Psychiatric/Behavioral: Negative.  Negative for depression and memory loss. The patient is not nervous/anxious and does not have insomnia (sleep more).   All other systems reviewed and are negative.  Performance status (ECOG):  1  Vitals Blood pressure 123/82, pulse 84, temperature (!) 97.5 F (36.4 C), temperature source Tympanic, resp. rate 20, weight (!) 341 lb 0.8 oz (154.7 kg), SpO2 97 %.   Physical Exam  Constitutional: She is oriented to person, place, and time. She appears well-developed and well-nourished. No distress.  HENT:  Head: Normocephalic and atraumatic.  Mouth/Throat: Oropharynx is clear and moist. No oropharyngeal exudate.  Dark brown hair.  Eyes: Pupils are equal, round, and reactive to light. Conjunctivae and EOM are normal. No scleral icterus.  Green eyes.  Neck: Normal range of motion. Neck supple. No JVD present.  Cardiovascular: Normal rate, regular rhythm and normal heart sounds.  Pulmonary/Chest: Effort normal and breath sounds normal. No respiratory distress. She has no wheezes.  Abdominal: Soft. Bowel sounds are normal. She exhibits no distension and no mass. There is no abdominal tenderness. There is no rebound and no guarding.  Musculoskeletal: Normal range of motion.        General: No tenderness or edema.  Lymphadenopathy:       Head (right side): No preauricular, no posterior auricular and no occipital adenopathy present.       Head (left side): No preauricular, no posterior auricular and no occipital adenopathy present.    She has no cervical adenopathy.    She has no axillary adenopathy.       Right: No inguinal and no supraclavicular adenopathy present.       Left: No inguinal and no supraclavicular adenopathy present.  Neurological: She is alert and oriented to person, place, and time.  Skin: Skin is warm and dry. No rash noted. She is  not diaphoretic. No erythema. No pallor.  Psychiatric: She has a normal mood and affect. Her behavior is normal. Judgment and thought content normal.  Nursing note and vitals reviewed.   No visits with results within 3 Day(s) from this visit.  Latest known visit with results is:  Appointment on 06/12/2019  Component Date Value Ref Range Status  . Folate 06/12/2019 25.0  >5.9 ng/mL Final   Performed at [facility], [address] [place] [place], [place] [address]  . Vitamin B-12 06/12/2019 1,594* 180 - 914 pg/mL Final   Comment: (NOTE) This assay is not validated for testing neonatal or myeloproliferative syndrome specimens for Vitamin B12 levels. Performed at [facility], [address] [place], [place] [address]   . Iron 06/12/2019 80  28 - 170 ug/dL Final  . TIBC 62/95/2841 385  250 - 450 ug/dL Final  . Saturation Ratios 06/12/2019 21  10.4 - 31.8 % Final  . UIBC 06/12/2019 305  ug/dL Final   Performed at  [facility], [address], [place], [place] [address]  . Ferritin 06/12/2019 20  11 - 307 ng/mL Final   Performed at [facility], [address] [place] [place], [place] [address]  . WBC 06/12/2019 11.9* 4.0 - 10.5 K/uL Final  . RBC 06/12/2019 4.99  3.87 - 5.11 MIL/uL Final  . Hemoglobin 06/12/2019 13.4  12.0 - 15.0 g/dL Final  . HCT 50/93/2671 41.6  36.0 - 46.0 % Final  . MCV 06/12/2019 83.4  80.0 - 100.0 fL Final  . MCH 06/12/2019 26.9  26.0 - 34.0 pg Final  . MCHC 06/12/2019 32.2  30.0 - 36.0 g/dL Final  . RDW 24/58/0998 14.9  11.5 - 15.5 % Final  . Platelets 06/12/2019 384  150 - 400 K/uL Final  . nRBC 06/12/2019 0.0  0.0 - 0.2 % Final  . Neutrophils Relative % 06/12/2019 64  % Final  . Neutro Abs 06/12/2019 7.5  1.7 - 7.7 K/uL Final  . Lymphocytes Relative 06/12/2019 28  % Final  . Lymphs Abs 06/12/2019 3.4  0.7 - 4.0 K/uL Final  . Monocytes Relative 06/12/2019 6  % Final  . Monocytes Absolute 06/12/2019 0.7  0.1 - 1.0 K/uL Final  .  Eosinophils Relative 06/12/2019 2  % Final  . Eosinophils Absolute 06/12/2019 0.2  0.0 - 0.5 K/uL Final  . Basophils Relative 06/12/2019 0  % Final  . Basophils Absolute 06/12/2019 0.1  0.0 - 0.1 K/uL Final  . Immature Granulocytes 06/12/2019 0  % Final  . Abs Immature Granulocytes 06/12/2019 0.03  0.00 - 0.07 K/uL Final   Performed at [facility], [address] [place], [place] [address]    Assessment:  Cosette Prindle is a 23 y.o. female withiron deficiency. She is being considered for gastric bypass surgery.Diethas been good since 10/2018. She notes heavy menses. She denies any hematuria, melena or hematochezia. Hemoglobin was 12.5 with a ferritinof7,iron saturation of 6% and a TIBC of 477on 02/13/2019.She is taking oral ironQOD.  Work up on 04/04/2019 revealed hematocrit 36.7, hemoglobin 12.0, MCV 79.8, platelets 352,000, WBC 10,600. Ferritin was 11. Retic was 2.2%. Anti-parietal cell antibody and intrinsic factor antibody were negative. Urinalysis revealed no hematuria.   She has B12 and folate deficiency.B12 was 208(low normal)and folic acid 5.9(> 6.5)on 02/13/2019.  B12 was 1594 and folate 25 on 06/12/2019.  She underwent EGDon 02/06/2019 (no report available). She has a history of H pylori.  Symptomatically, she is fatigued.  She denies sleep apnea.  Hemoglobin 13.4.  Plan: 1.   Review labs from 06/12/2019. 2. Iron deficiency Hematocrit 38.6.  Hemoglobin 12.3.  MCV 81.1 on 04/17/2019.              Ferritin 13. Hematocrit 41.6.  Hemoglobin 13.4.  MCV 83.4 on 06/12/2019.              Ferritin 20.  Goal ferritin 100.             She is taking oral iron QOD with water.   Discuss taking with OJ or vitamin C. Review patient has iron deficiency but no anemia.               She will likely require IV iron after gastric bypass surgery.                         Surgery scheduled for 07/01/2019.   Attempt made  to contact her surgeon, Dr Caffie Damme [phone]).  Continue to monitor. 3.   B12 and folate deficiency B12 was 208 and folate 5.9 on 02/03/2019. She is taking B12 1000 mcg a day and folic acid 1 mg a day. B12 was 1594 and folate 25 on 06/12/2019.  Recheck levels after surgery. 4.   RTC approximately 1 month s/p gastric bypass for MD assessment, labs (CBC, ferritin, iron studies- day before), and +/- Venofer.  I discussed the assessment and treatment plan with the patient.  The patient was provided an opportunity to ask questions and all were answered.  The patient agreed with the plan and demonstrated an understanding of the instructions.  The patient was advised to call back if the symptoms worsen or if the condition fails to improve as anticipated.  I provided 14 minutes (3:32 PM - 3:45 PM) of face-to-face time during this this encounter and > 50% was spent counseling as documented under my assessment and plan.    Lequita Asal, MD, PhD    06/17/2019, 3:32 PM  I, Samul Dada, am acting as a scribe for Lequita Asal, MD.  I, [place] Mike Gip, MD, have reviewed the above documentation for accuracy and completeness, and I agree with the above.

## 2019-06-17 ENCOUNTER — Inpatient Hospital Stay: Payer: 59

## 2019-06-17 ENCOUNTER — Ambulatory Visit: Payer: 59 | Admitting: Hematology and Oncology

## 2019-06-17 ENCOUNTER — Other Ambulatory Visit: Payer: 59

## 2019-06-17 ENCOUNTER — Inpatient Hospital Stay (HOSPITAL_BASED_OUTPATIENT_CLINIC_OR_DEPARTMENT_OTHER): Payer: 59 | Admitting: Hematology and Oncology

## 2019-06-17 ENCOUNTER — Other Ambulatory Visit: Payer: Self-pay

## 2019-06-17 VITALS — BP 123/82 | HR 84 | Temp 97.5°F | Resp 20 | Wt 341.1 lb

## 2019-06-17 DIAGNOSIS — E538 Deficiency of other specified B group vitamins: Secondary | ICD-10-CM | POA: Diagnosis not present

## 2019-06-17 DIAGNOSIS — D509 Iron deficiency anemia, unspecified: Secondary | ICD-10-CM | POA: Diagnosis not present

## 2019-06-17 DIAGNOSIS — E611 Iron deficiency: Secondary | ICD-10-CM | POA: Diagnosis not present

## 2019-06-17 NOTE — Progress Notes (Signed)
Patient here for follow up. Denies any concerns.  

## 2019-06-17 NOTE — Progress Notes (Signed)
IV placed in left hand while patient was waiting to see MD. No iron for today so IV removed.

## 2019-12-31 ENCOUNTER — Other Ambulatory Visit: Payer: Self-pay

## 2019-12-31 ENCOUNTER — Ambulatory Visit: Payer: 59 | Admitting: Physician Assistant

## 2019-12-31 ENCOUNTER — Encounter: Payer: Self-pay | Admitting: Physician Assistant

## 2019-12-31 DIAGNOSIS — B373 Candidiasis of vulva and vagina: Secondary | ICD-10-CM

## 2019-12-31 DIAGNOSIS — Z3202 Encounter for pregnancy test, result negative: Secondary | ICD-10-CM

## 2019-12-31 DIAGNOSIS — Z113 Encounter for screening for infections with a predominantly sexual mode of transmission: Secondary | ICD-10-CM

## 2019-12-31 DIAGNOSIS — B3731 Acute candidiasis of vulva and vagina: Secondary | ICD-10-CM

## 2019-12-31 MED ORDER — CLOTRIMAZOLE 1 % VA CREA: 1.0000 | TOPICAL_CREAM | Freq: Every day | VAGINAL | 0 refills | Status: AC

## 2019-12-31 NOTE — Progress Notes (Signed)
UPT is negative today. Wet mount reviewed and pt treated for yeast per provider order and standing order. Counseled pt per provider orders and pt states understanding. Pt declined discussing birth control in clinic today with provider, but upon posting pt requests to be scheduled for a later date for a physical and to possibly start birth control. Pt reports she is unsure, but may be interested in getting an IUD. Pt scheduled for PE and possible IUD insertion for 01/14/2020 at 3:30pm per pt request. Pt aware of appt date and time to arrive early for check-in. Pt reports she is not interested in ECP today. Counseled pt that from now until coming in for appt to either not have sex or use condoms every time, and pt states understanding. Provider orders completed.

## 2019-12-31 NOTE — Progress Notes (Signed)
Pt here for STD screening and desires UPT today. Pt reports she is having symptoms today.

## 2020-01-01 NOTE — Progress Notes (Signed)
[facility] Department STI clinic/screening visit  Subjective:  Sarah Noble is a 24 y.o. female being seen today for an STI screening visit. The patient reports they do have symptoms.  Patient reports that they do not desire a pregnancy in the next year.   They reported they are not interested in discussing contraception today.  No LMP recorded (lmp unknown).   Patient has the following medical conditions:   Patient Active Problem List   Diagnosis Date Noted  . Iron deficiency anemia 04/04/2019  . B12 deficiency 04/04/2019  . Folate deficiency 04/04/2019    Chief Complaint  Patient presents with  . SEXUALLY TRANSMITTED DISEASE    screening     HPI  Patient reports that she has had "extreme" itching for 3 days and yellow/green discharge for 2 days.  Reports history of asthma and taking MVI daily.  Reports that she has had her tonsils removed, last had HIV testing 2-3 years ago, her last pap was about 3 years ago and last period was "sometime in April." Reports that she had gastric bypass surgery and has lost about 100 lbs so far.  See flowsheet for further details and programmatic requirements.    The following portions of the patient's history were reviewed and updated as appropriate: allergies, current medications, past medical history, past social history, past surgical history and problem list.  Objective:  There were no vitals filed for this visit.  Physical Exam Constitutional:      General: She is not in acute distress.    Appearance: Normal appearance.  HENT:     Head: Normocephalic and atraumatic.     Comments: No nits, lice, or hair loss. No cervical, supraclavicular or axillary adenopathy.     Mouth/Throat:     Mouth: Mucous membranes are moist.     Pharynx: Oropharynx is clear. No oropharyngeal exudate or posterior oropharyngeal erythema.  Eyes:     Conjunctiva/sclera: Conjunctivae normal.  Pulmonary:     Effort: Pulmonary effort is  normal.  Abdominal:     Palpations: Abdomen is soft. There is no mass.     Tenderness: There is no abdominal tenderness. There is no guarding or rebound.  Genitourinary:    General: Normal vulva.     Rectum: Normal.     Comments: External genitalia/pubic area without nits, lice, edema, erythema, lesions and inguinal adenopathy. Vagina with normal mucosa , small amount of yellowish/whitish clumping discharge. PH=4.5. Cervix without visible lesions. Uterus firm, mobile, nt, no masses, no CMT, no adnexal tenderness or fullness. Musculoskeletal:     Cervical back: Neck supple. No tenderness.  Skin:    General: Skin is warm and dry.     Findings: No bruising, erythema, lesion or rash.  Neurological:     Mental Status: She is alert and oriented to person, place, and time.  Psychiatric:        Mood and Affect: Mood normal.        Behavior: Behavior normal.        Thought Content: Thought content normal.        Judgment: Judgment normal.      Assessment and Plan:  Sarah Noble is a 24 y.o. female presenting to the [facility] Department for STI screening  1. Screening for STD (sexually transmitted disease) Patient into clinic with symptoms today. Rec condoms with all sex. Await test results.  Counseled that RN will call if needs to RTC for further treatment once results are back.  -  Pregnancy, urine - WET PREP FOR TRICH, YEAST, CLUE - Gonococcus culture - Chlamydia/Gonorrhea [place] [facility] - HIV [place] [facility] - Syphilis Serology, [facility]  2. Vaginal candidiasis Treat with Clotrimazole 1% vaginal cream 1 app qhs for 7 days. No sex for 7 days. - clotrimazole (CLOTRIMAZOLE-7) 1 % vaginal cream; Place 1 Applicatorful vaginally at bedtime for 7 days.  Dispense: 45 g; Refill: 0  3. Pregnancy examination or test, negative result Pregnancy test negative today. Offer patient appt in [facility] to discuss BCMs.     Return for 2-3 weeks for TR's, PRN, 01/14/2020 PE and  possible IUD.  Future Appointments  Date Time Provider [place]  01/14/2020  3:30 PM [facility] PROVIDER [facility] None    Jerene Dilling, PA

## 2020-01-05 LAB — GONOCOCCUS CULTURE

## 2020-01-06 LAB — PREGNANCY, URINE: Preg Test, Ur: NEGATIVE

## 2020-01-06 LAB — WET PREP FOR TRICH, YEAST, CLUE: Trichomonas Exam: NEGATIVE

## 2020-01-14 ENCOUNTER — Emergency Department: Payer: 59

## 2020-01-14 ENCOUNTER — Other Ambulatory Visit: Payer: Self-pay

## 2020-01-14 ENCOUNTER — Encounter: Payer: Self-pay | Admitting: Emergency Medicine

## 2020-01-14 ENCOUNTER — Ambulatory Visit: Payer: Self-pay

## 2020-01-14 ENCOUNTER — Emergency Department
Admission: EM | Admit: 2020-01-14 | Discharge: 2020-01-14 | Disposition: A | Discharge status: Home / Self Care | Payer: 59 | Attending: Student in an Organized Health Care Education/Training Program | Admitting: Student in an Organized Health Care Education/Training Program

## 2020-01-14 DIAGNOSIS — Z853 Personal history of malignant neoplasm of breast: Secondary | ICD-10-CM | POA: Insufficient documentation

## 2020-01-14 DIAGNOSIS — J45909 Unspecified asthma, uncomplicated: Secondary | ICD-10-CM | POA: Diagnosis not present

## 2020-01-14 DIAGNOSIS — R1030 Lower abdominal pain, unspecified: Secondary | ICD-10-CM | POA: Diagnosis present

## 2020-01-14 DIAGNOSIS — R1013 Epigastric pain: Secondary | ICD-10-CM

## 2020-01-14 DIAGNOSIS — N83209 Unspecified ovarian cyst, unspecified side: Secondary | ICD-10-CM | POA: Diagnosis not present

## 2020-01-14 DIAGNOSIS — F17290 Nicotine dependence, other tobacco product, uncomplicated: Secondary | ICD-10-CM | POA: Insufficient documentation

## 2020-01-14 DIAGNOSIS — Z79899 Other long term (current) drug therapy: Secondary | ICD-10-CM | POA: Diagnosis not present

## 2020-01-14 LAB — URINALYSIS, COMPLETE (UACMP) WITH MICROSCOPIC
Bacteria, UA: NONE SEEN
Bilirubin Urine: NEGATIVE
Glucose, UA: NEGATIVE mg/dL
Hgb urine dipstick: NEGATIVE
Ketones, ur: 20 mg/dL — AB
Leukocytes,Ua: NEGATIVE
Nitrite: NEGATIVE
Protein, ur: NEGATIVE mg/dL
Specific Gravity, Urine: 1.027 (ref 1.005–1.030)
pH: 5 (ref 5.0–8.0)

## 2020-01-14 LAB — COMPREHENSIVE METABOLIC PANEL
ALT: 24 U/L (ref 0–44)
AST: 17 U/L (ref 15–41)
Albumin: 4.2 g/dL (ref 3.5–5.0)
Alkaline Phosphatase: 71 U/L (ref 38–126)
Anion gap: 12 (ref 5–15)
BUN: 11 mg/dL (ref 6–20)
CO2: 24 mmol/L (ref 22–32)
Calcium: 9.2 mg/dL (ref 8.9–10.3)
Chloride: 103 mmol/L (ref 98–111)
Creatinine, Ser: 0.6 mg/dL (ref 0.44–1.00)
GFR calc Af Amer: >60 mL/min (ref >60)
GFR calc non Af Amer: >60 mL/min (ref >60)
Glucose, Bld: 82 mg/dL (ref 70–99)
Potassium: 3.3 mmol/L — ABNORMAL LOW (ref 3.5–5.1)
Sodium: 139 mmol/L (ref 135–145)
Total Bilirubin: 1.4 mg/dL — ABNORMAL HIGH (ref 0.3–1.2)
Total Protein: 7.3 g/dL (ref 6.5–8.1)

## 2020-01-14 LAB — CBC
HCT: 38.3 % (ref 36.0–46.0)
Hemoglobin: 12.7 g/dL (ref 12.0–15.0)
MCH: 28.9 pg (ref 26.0–34.0)
MCHC: 33.2 g/dL (ref 30.0–36.0)
MCV: 87.2 fL (ref 80.0–100.0)
Platelets: 301 10*3/uL (ref 150–400)
RBC: 4.39 MIL/uL (ref 3.87–5.11)
RDW: 13.4 % (ref 11.5–15.5)
WBC: 7.4 10*3/uL (ref 4.0–10.5)
nRBC: 0 % (ref 0.0–0.2)

## 2020-01-14 LAB — LIPASE, BLOOD: Lipase: 21 U/L (ref 11–51)

## 2020-01-14 LAB — POCT PREGNANCY, URINE: Preg Test, Ur: NEGATIVE

## 2020-01-14 MED ORDER — OXYCODONE-ACETAMINOPHEN 5-325 MG PO TABS
1.0000 | ORAL_TABLET | Freq: Once | ORAL | Status: AC
  Administered 2020-01-14: 1 via ORAL
  Filled 2020-01-14: qty 1

## 2020-01-14 MED ORDER — MORPHINE SULFATE (PF) 4 MG/ML IV SOLN
4.0000 mg | INTRAVENOUS | Status: DC | PRN
  Administered 2020-01-14: 4 mg via INTRAVENOUS
  Filled 2020-01-14: qty 1

## 2020-01-14 MED ORDER — SODIUM CHLORIDE 0.9 % IV BOLUS
500.0000 mL | Freq: Once | INTRAVENOUS | Status: AC
  Administered 2020-01-14: 500 mL via INTRAVENOUS

## 2020-01-14 MED ORDER — IOHEXOL 9 MG/ML PO SOLN: 1000.0000 mL | ORAL | Status: DC

## 2020-01-14 MED ORDER — FAMOTIDINE 20 MG PO TABS
20.0000 mg | ORAL_TABLET | Freq: Two times a day (BID) | ORAL | 0 refills | Status: DC
Start: 2020-01-14 — End: 2020-02-25

## 2020-01-14 MED ORDER — SODIUM CHLORIDE 0.9 % IV BOLUS
1000.0000 mL | Freq: Once | INTRAVENOUS | Status: AC
  Administered 2020-01-14: 1000 mL via INTRAVENOUS

## 2020-01-14 MED ORDER — SUCRALFATE 1 G PO TABS
1.0000 g | ORAL_TABLET | Freq: Four times a day (QID) | ORAL | 1 refills | Status: DC
Start: 2020-01-14 — End: 2020-02-25

## 2020-01-14 MED ORDER — IOHEXOL 9 MG/ML PO SOLN
1000.0000 mL | Freq: Once | ORAL | Status: AC | PRN
  Administered 2020-01-14: 1000 mL via ORAL

## 2020-01-14 MED ORDER — IOHEXOL 300 MG/ML  SOLN
100.0000 mL | Freq: Once | INTRAMUSCULAR | Status: AC | PRN
  Administered 2020-01-14: 100 mL via INTRAVENOUS

## 2020-01-14 MED ORDER — SUCRALFATE 1 G PO TABS
1.0000 g | ORAL_TABLET | Freq: Once | ORAL | Status: AC
  Administered 2020-01-14: 1 g via ORAL
  Filled 2020-01-14: qty 1

## 2020-01-14 MED ORDER — ONDANSETRON HCL 4 MG/2ML IJ SOLN
4.0000 mg | Freq: Once | INTRAMUSCULAR | Status: AC
  Administered 2020-01-14: 4 mg via INTRAVENOUS
  Filled 2020-01-14: qty 2

## 2020-01-14 NOTE — ED Notes (Signed)
Patient transported to CT 

## 2020-01-14 NOTE — Discharge Instructions (Addendum)

## 2020-01-14 NOTE — ED Notes (Signed)
Pt's mother at bedside.

## 2020-01-14 NOTE — ED Notes (Signed)
Patient transported to Ultrasound 

## 2020-01-14 NOTE — ED Provider Notes (Signed)
[facility] Emergency Department Provider Note    First MD Initiated Contact with Patient 01/14/20 1128     (approximate)  I have reviewed the triage vital signs and the nursing notes.   HISTORY  Chief Complaint Abdominal Pain and Nausea    HPI Sarah Noble is a 24 y.o. female 47-month status post gastric bypass surgery presents to the ER for several days of progressively worsening epigastric pain nausea vomiting.  States she did drink alcohol over the weekend but is never had pain like this before.  Has had some chills.  Denies any dysuria.  No back pain or flank pain.  No chest pain or shortness of breath.    Past Medical History:  Diagnosis Date  . Asthma   . Breast mass    palpable 9:00 Lt breast  . Mastitis 2015   right breast   Family History  Problem Relation Age of Onset  . Breast cancer Paternal Grandmother   . Breast cancer Maternal Grandmother   . Skin cancer Maternal Grandmother   . Iron deficiency Mother    Past Surgical History:  Procedure Laterality Date  . ACHILLES TENDON SURGERY  2012  . TONSILLECTOMY    . UPPER GI ENDOSCOPY     Patient Active Problem List   Diagnosis Date Noted  . Iron deficiency anemia 04/04/2019  . B12 deficiency 04/04/2019  . Folate deficiency 04/04/2019      Prior to Admission medications   Medication Sig Start Date End Date Taking? Authorizing Provider  albuterol (PROVENTIL HFA;VENTOLIN HFA) 108 (90 Base) MCG/ACT inhaler Inhale 2 puffs every 4 (four) hours as needed into the lungs. 05/29/17   Renford Dills, NP  ferrous sulfate 325 (65 FE) MG tablet Take 325 mg by mouth every other day.    [provider]  fluticasone (FLONASE) 50 MCG/ACT nasal spray Place 1 spray into both nostrils as needed.  03/14/19 04/13/19  [provider]  gabapentin (NEURONTIN) 300 MG capsule Take by mouth. 04/30/19   [provider]  ondansetron (ZOFRAN-ODT) 4 MG disintegrating tablet   04/30/19   [provider]  pantoprazole (PROTONIX) 40 MG tablet Take 40 mg by mouth as needed.  10/30/18   [provider]  scopolamine (TRANSDERM-SCOP) 1 MG/3DAYS  05/01/19   [provider]    Allergies Patient has no known allergies.    Social History Social History   Tobacco Use  . Smoking status: Current Some Day Smoker    Types: E-cigarettes  . Smokeless tobacco: Never Used  . Tobacco comment: vapes, not every day  Vaping Use  . Vaping Use: Never used  Substance Use Topics  . Alcohol use: Yes    Comment: occasionally  . Drug use: No    Review of Systems Patient denies headaches, rhinorrhea, blurry vision, numbness, shortness of breath, chest pain, edema, cough, abdominal pain, nausea, vomiting, diarrhea, dysuria, fevers, rashes or hallucinations unless otherwise stated above in HPI. ____________________________________________   PHYSICAL EXAM:  VITAL SIGNS: Vitals:   01/14/20 1300 01/14/20 1400  BP: 126/82 123/77  Pulse: (!) 40 (!) 43  Resp: 13 20  Temp:    SpO2: 99% 99%    Constitutional: Alert and oriented.  Eyes: Conjunctivae are normal.  Head: Atraumatic. Nose: No congestion/rhinnorhea. Mouth/Throat: Mucous membranes are moist.   Neck: No stridor. Painless ROM.  Cardiovascular: Normal rate, regular rhythm. Grossly normal heart sounds.  Good peripheral circulation. Respiratory: Normal respiratory effort.  No retractions. Lungs CTAB. Gastrointestinal:  Soft and nontender. No distention. No abdominal bruits. No CVA tenderness. Genitourinary:  Musculoskeletal: No lower extremity tenderness nor edema.  No joint effusions. Neurologic:  Normal speech and language. No gross focal neurologic deficits are appreciated. No facial droop Skin:  Skin is warm, dry and intact. No rash noted. Psychiatric: Mood and affect are normal. Speech and behavior are normal.  ____________________________________________   LABS (all labs ordered are  listed, but only abnormal results are displayed)  Results for orders placed or performed during the hospital encounter of 01/14/20 (from the past 24 hour(s))  Urinalysis, Complete w Microscopic     Status: Abnormal   Collection Time: 01/14/20  9:51 AM  Result Value Ref Range   Color, Urine AMBER (A) YELLOW   APPearance HAZY (A) CLEAR   Specific Gravity, Urine 1.027 1.005 - 1.030   pH 5.0 5.0 - 8.0   Glucose, UA NEGATIVE NEGATIVE mg/dL   Hgb urine dipstick NEGATIVE NEGATIVE   Bilirubin Urine NEGATIVE NEGATIVE   Ketones, ur 20 (A) NEGATIVE mg/dL   Protein, ur NEGATIVE NEGATIVE mg/dL   Nitrite NEGATIVE NEGATIVE   Leukocytes,Ua NEGATIVE NEGATIVE   RBC / HPF 0-5 0 - 5 RBC/hpf   WBC, UA 0-5 0 - 5 WBC/hpf   Bacteria, UA NONE SEEN NONE SEEN   Squamous Epithelial / LPF 0-5 0 - 5   Mucus PRESENT   Lipase, blood     Status: None   Collection Time: 01/14/20 10:20 AM  Result Value Ref Range   Lipase 21 11 - 51 U/L  Comprehensive metabolic panel     Status: Abnormal   Collection Time: 01/14/20 10:20 AM  Result Value Ref Range   Sodium 139 135 - 145 mmol/L   Potassium 3.3 (L) 3.5 - 5.1 mmol/L   Chloride 103 98 - 111 mmol/L   CO2 24 22 - 32 mmol/L   Glucose, Bld 82 70 - 99 mg/dL   BUN 11 6 - 20 mg/dL   Creatinine, Ser 7.61 0.44 - 1.00 mg/dL   Calcium 9.2 8.9 - 60.7 mg/dL   Total Protein 7.3 6.5 - 8.1 g/dL   Albumin 4.2 3.5 - 5.0 g/dL   AST 17 15 - 41 U/L   ALT 24 0 - 44 U/L   Alkaline Phosphatase 71 38 - 126 U/L   Total Bilirubin 1.4 (H) 0.3 - 1.2 mg/dL   GFR calc non Af Amer >60 >60 mL/min   GFR calc Af Amer >60 >60 mL/min   Anion gap 12 5 - 15  CBC     Status: None   Collection Time: 01/14/20 10:20 AM  Result Value Ref Range   WBC 7.4 4.0 - 10.5 K/uL   RBC 4.39 3.87 - 5.11 MIL/uL   Hemoglobin 12.7 12.0 - 15.0 g/dL   HCT 37.1 36 - 46 %   MCV 87.2 80.0 - 100.0 fL   MCH 28.9 26.0 - 34.0 pg   MCHC 33.2 30.0 - 36.0 g/dL   RDW 06.2 69.4 - 85.4 %   Platelets 301 150 - 400 K/uL    nRBC 0.0 0.0 - 0.2 %  Pregnancy, urine POC     Status: None   Collection Time: 01/14/20 10:41 AM  Result Value Ref Range   Preg Test, Ur NEGATIVE NEGATIVE   ____________________________________________  EKG My review and personal interpretation at Time: 10:45   Indication: epigastric pain  Rate: 40  Rhythm: sinus Axis: normal Other: normal intervals, no stemi ____________________________________________  RADIOLOGY  I  personally reviewed all radiographic images ordered to evaluate for the above acute complaints and reviewed radiology reports and findings.  These findings were personally discussed with the patient.  Please see medical record for radiology report.  ____________________________________________   PROCEDURES  Procedure(s) performed:  Procedures    Critical Care performed: no ____________________________________________   INITIAL IMPRESSION / ASSESSMENT AND PLAN / ED COURSE  Pertinent labs & imaging results that were available during my care of the patient were reviewed by me and considered in my medical decision making (see chart for details).   DDX: gastritis, enteritis, sbo, pouchitis, pud, pancreatitis, biliary pathology  Abryana Lykens is a 24 y.o. who presents to the ED with symptoms as described above.  She is well and nontoxic appearing.  CT imaging was ordered for the but differential given her postop state.  She not febrile.  Will give IV fluids as well as IV pain medication  Clinical Course as of Jan 13 1499  Tue Jan 14, 2020  1356 Repeat exam is reassuring.  Remains hemodynamically stable.  She is not complaining of any acute lower abdominal pain is now primarily epigastric.  We will try some Carafate evaluate she is having some gastritis.  No evidence of SBO perforation or other acute epigastric pathology.  Incidentally noted large ovarian cyst with possible hydrosalpinx.  Will order ultrasound she states that she does have intermittent pain  related to this but is currently pain-free.  Discussed the case in consultation with Dr. Tiburcio Pea of OB/GYN states would be appropriate for close outpatient follow-up in clinic for possible cystectomy assuming that the ultrasound results are reassuring.  Patient was signed out to oncoming physician, Dr. Scotty Court.   [PR]    Clinical Course User Index [PR] Willy Eddy, MD    The patient was evaluated in Emergency Department today for the symptoms described in the history of present illness. He/she was evaluated in the context of the global COVID-19 pandemic, which necessitated consideration that the patient might be at risk for infection with the SARS-CoV-2 virus that causes COVID-19. Institutional protocols and algorithms that pertain to the evaluation of patients at risk for COVID-19 are in a state of rapid change based on information released by regulatory bodies including the CDC and federal and state organizations. These policies and algorithms were followed during the patient's care in the ED.  As part of my medical decision making, I reviewed the following data within the electronic MEDICAL RECORD NUMBER Nursing notes reviewed and incorporated, Labs reviewed, notes from prior ED visits and [place] Controlled Substance Database   ____________________________________________   FINAL CLINICAL IMPRESSION(S) / ED DIAGNOSES  Final diagnoses:  Ovarian cyst  Epigastric pain      NEW MEDICATIONS STARTED DURING THIS VISIT:  New Prescriptions   No medications on file     Note:  This document was prepared using Dragon voice recognition software and may include unintentional dictation errors.    Willy Eddy, MD 01/14/20 1500

## 2020-01-14 NOTE — ED Triage Notes (Signed)
Pt reports had gastric bypass in January and is now having severe abd pain to her right side that travels across and up. Pt reports also feels nauseated but cannot vomit.

## 2020-01-14 NOTE — ED Provider Notes (Signed)
Procedures  Clinical Course as of Jan 13 1706  Tue Jan 14, 2020  1356 Repeat exam is reassuring.  Remains hemodynamically stable.  She is not complaining of any acute lower abdominal pain is now primarily epigastric.  We will try some Carafate evaluate she is having some gastritis.  No evidence of SBO perforation or other acute epigastric pathology.  Incidentally noted large ovarian cyst with possible hydrosalpinx.  Will order ultrasound she states that she does have intermittent pain related to this but is currently pain-free.  Discussed the case in consultation with Dr. Tiburcio Pea of OB/GYN states would be appropriate for close outpatient follow-up in clinic for possible cystectomy assuming that the ultrasound results are reassuring.  Patient was signed out to oncoming physician, Dr. Scotty Court.   [PR]    Clinical Course User Index [PR] Willy Eddy, MD    ----------------------------------------- 5:07 PM on 01/14/2020 ----------------------------------------- Ultrasound shows 7 cm ovarian cyst, normal blood flow, no evidence of torsion on the ultrasound.  Patient reexamined, she is feeling better after receiving GI cocktail, she is nontoxic sitting upright tolerating oral intake and well-appearing.  Abdominal exam is benign except for some mild left upper quadrant tenderness consistent with gastritis.  Stable for outpatient follow-up with gynecology and primary care.    Sharman Cheek, MD 01/14/20 [phone]

## 2020-02-25 ENCOUNTER — Ambulatory Visit
Admission: EM | Admit: 2020-02-25 | Discharge: 2020-02-25 | Disposition: A | Discharge status: Home / Self Care | Payer: 59 | Attending: Internal Medicine | Admitting: Internal Medicine

## 2020-02-25 ENCOUNTER — Other Ambulatory Visit: Payer: Self-pay

## 2020-02-25 DIAGNOSIS — Z79899 Other long term (current) drug therapy: Secondary | ICD-10-CM | POA: Insufficient documentation

## 2020-02-25 DIAGNOSIS — Z9884 Bariatric surgery status: Secondary | ICD-10-CM | POA: Insufficient documentation

## 2020-02-25 DIAGNOSIS — K2920 Alcoholic gastritis without bleeding: Secondary | ICD-10-CM | POA: Insufficient documentation

## 2020-02-25 DIAGNOSIS — R519 Headache, unspecified: Secondary | ICD-10-CM | POA: Diagnosis not present

## 2020-02-25 DIAGNOSIS — D509 Iron deficiency anemia, unspecified: Secondary | ICD-10-CM | POA: Diagnosis not present

## 2020-02-25 DIAGNOSIS — F17290 Nicotine dependence, other tobacco product, uncomplicated: Secondary | ICD-10-CM | POA: Diagnosis not present

## 2020-02-25 DIAGNOSIS — Z20822 Contact with and (suspected) exposure to covid-19: Secondary | ICD-10-CM | POA: Diagnosis not present

## 2020-02-25 DIAGNOSIS — R109 Unspecified abdominal pain: Secondary | ICD-10-CM | POA: Diagnosis present

## 2020-02-25 LAB — CBC WITH DIFFERENTIAL/PLATELET
Abs Immature Granulocytes: 0.03 10*3/uL (ref 0.00–0.07)
Basophils Absolute: 0 10*3/uL (ref 0.0–0.1)
Basophils Relative: 0 %
Eosinophils Absolute: 0 10*3/uL (ref 0.0–0.5)
Eosinophils Relative: 0 %
HCT: 38.2 % (ref 36.0–46.0)
Hemoglobin: 12.8 g/dL (ref 12.0–15.0)
Immature Granulocytes: 0 %
Lymphocytes Relative: 19 %
Lymphs Abs: 2 10*3/uL (ref 0.7–4.0)
MCH: 28.9 pg (ref 26.0–34.0)
MCHC: 33.5 g/dL (ref 30.0–36.0)
MCV: 86.2 fL (ref 80.0–100.0)
Monocytes Absolute: 0.7 10*3/uL (ref 0.1–1.0)
Monocytes Relative: 6 %
Neutro Abs: 8.1 10*3/uL — ABNORMAL HIGH (ref 1.7–7.7)
Neutrophils Relative %: 75 %
Platelets: 319 10*3/uL (ref 150–400)
RBC: 4.43 MIL/uL (ref 3.87–5.11)
RDW: 13.5 % (ref 11.5–15.5)
WBC: 10.9 10*3/uL — ABNORMAL HIGH (ref 4.0–10.5)
nRBC: 0 % (ref 0.0–0.2)

## 2020-02-25 LAB — COMPREHENSIVE METABOLIC PANEL
ALT: 12 U/L (ref 0–44)
AST: 11 U/L — ABNORMAL LOW (ref 15–41)
Albumin: 3.9 g/dL (ref 3.5–5.0)
Alkaline Phosphatase: 67 U/L (ref 38–126)
Anion gap: 15 (ref 5–15)
BUN: 12 mg/dL (ref 6–20)
CO2: 25 mmol/L (ref 22–32)
Calcium: 10 mg/dL (ref 8.9–10.3)
Chloride: 98 mmol/L (ref 98–111)
Creatinine, Ser: 0.63 mg/dL (ref 0.44–1.00)
GFR calc Af Amer: >60 mL/min (ref >60)
GFR calc non Af Amer: >60 mL/min (ref >60)
Glucose, Bld: 79 mg/dL (ref 70–99)
Potassium: 3.2 mmol/L — ABNORMAL LOW (ref 3.5–5.1)
Sodium: 138 mmol/L (ref 135–145)
Total Bilirubin: 0.8 mg/dL (ref 0.3–1.2)
Total Protein: 7.2 g/dL (ref 6.5–8.1)

## 2020-02-25 LAB — URINALYSIS, COMPLETE (UACMP) WITH MICROSCOPIC
Glucose, UA: NEGATIVE mg/dL
Hgb urine dipstick: NEGATIVE
Ketones, ur: 40 mg/dL — AB
Leukocytes,Ua: NEGATIVE
Nitrite: NEGATIVE
Protein, ur: 30 mg/dL — AB
Specific Gravity, Urine: >1.03 — ABNORMAL HIGH (ref 1.005–1.030)
pH: 5.5 (ref 5.0–8.0)

## 2020-02-25 LAB — LIPASE, BLOOD: Lipase: 19 U/L (ref 11–51)

## 2020-02-25 MED ORDER — PANTOPRAZOLE SODIUM 20 MG PO TBEC: 20.0000 mg | DELAYED_RELEASE_TABLET | Freq: Every day | ORAL | 0 refills | Status: AC

## 2020-02-25 MED ORDER — ALUM & MAG HYDROXIDE-SIMETH 200-200-20 MG/5ML PO SUSP
30.0000 mL | Freq: Once | ORAL | Status: AC
  Administered 2020-02-25: 30 mL via ORAL

## 2020-02-25 MED ORDER — SUCRALFATE 1 GM/10ML PO SUSP: 1.0000 g | Freq: Three times a day (TID) | ORAL | 0 refills | Status: AC

## 2020-02-25 MED ORDER — ONDANSETRON 8 MG PO TBDP
8.0000 mg | ORAL_TABLET | Freq: Once | ORAL | Status: AC
  Administered 2020-02-25: 8 mg via ORAL

## 2020-02-25 MED ORDER — LIDOCAINE VISCOUS HCL 2 % MT SOLN
15.0000 mL | Freq: Once | OROMUCOSAL | Status: AC
  Administered 2020-02-25: 15 mL via ORAL

## 2020-02-25 MED ORDER — ONDANSETRON 4 MG PO TBDP: 4.0000 mg | ORAL_TABLET | Freq: Three times a day (TID) | ORAL | 0 refills | Status: AC | PRN

## 2020-02-25 NOTE — ED Triage Notes (Addendum)
Patient in today w/ c/o N/V, H/A,  abdominal pain that radiates from RUQ, Epigastric, LUQ, and to LLQ. She describes the pain as constant dull, and cramping. Patient states she took a Plan B yesterday. Patient also states that she is having mild chest pain w/ tightness on the left side w/ no radiation. Patient denies loss of taste or smell. Patient also states she had gastric bypass Jul 29, 2019 and says she has lost approx. 140 lbs post-op.

## 2020-02-25 NOTE — ED Provider Notes (Signed)
[facility]    CSN: [id number] Arrival date & time: 02/25/20  1330      History   Chief Complaint Chief Complaint  Patient presents with  . Headache  . Nausea  . Abdominal Pain  . Chest Pain    mild  . Emesis    HPI Sarah Noble is a 24 y.o. female comes to the urgent care with complaints of epigastric left upper quadrant and right upper quadrant abdominal pain which started a couple days ago.  Patient was at the beach over the weekend.  She drank heavily over the weekend.  She was drinking liquor.  Returning from the beach she started experiencing abdominal pain, nausea and nonbloody/nonbilious vomiting.  The pain has persisted and hence the visit to the urgent care today.  Pain is constant dull and with intermittent cramping.  Aggravated by food intake.  No known relieving factors.  She is taking nausea medication with no improvement.  No radiation of pain to the back.  Patient denies any dizziness.  Patient had a gastric bypass on January/21.   HPI  Past Medical History:  Diagnosis Date  . Asthma   . Breast mass    palpable 9:00 Lt breast  . Mastitis 2015   right breast    Patient Active Problem List   Diagnosis Date Noted  . Iron deficiency anemia 04/04/2019  . B12 deficiency 04/04/2019  . Folate deficiency 04/04/2019    Past Surgical History:  Procedure Laterality Date  . ACHILLES TENDON SURGERY  2012  . GASTRIC ROUX-EN-Y    . TONSILLECTOMY    . UPPER GI ENDOSCOPY      OB History   No obstetric history on file.      Home Medications    Prior to Admission medications   Medication Sig Start Date End Date Taking? Authorizing Provider  albuterol (PROVENTIL HFA;VENTOLIN HFA) 108 (90 Base) MCG/ACT inhaler Inhale 2 puffs every 4 (four) hours as needed into the lungs. 05/29/17  Yes Renford Dills, NP  ferrous sulfate 325 (65 FE) MG tablet Take 325 mg by mouth every other day.   Yes [provider]  fluticasone (FLONASE) 50  MCG/ACT nasal spray Place 1 spray into both nostrils as needed.  03/14/19 04/13/19  [provider]  ondansetron (ZOFRAN ODT) 4 MG disintegrating tablet Take 1 tablet (4 mg total) by mouth every 8 (eight) hours as needed for nausea or vomiting. 02/25/20   Willford Rabideau, Britta Mccreedy, MD  pantoprazole (PROTONIX) 20 MG tablet Take 1 tablet (20 mg total) by mouth daily. 02/25/20   Clarence Cogswell, Britta Mccreedy, MD  sucralfate (CARAFATE) 1 GM/10ML suspension Take 10 mLs (1 g total) by mouth 4 (four) times daily -  with meals and at bedtime. 02/25/20   Merrilee Jansky, MD  famotidine (PEPCID) 20 MG tablet Take 1 tablet (20 mg total) by mouth 2 (two) times daily. 01/14/20 02/25/20  Sharman Cheek, MD  gabapentin (NEURONTIN) 300 MG capsule Take by mouth. 04/30/19 02/25/20  [provider]    Family History Family History  Problem Relation Age of Onset  . Breast cancer Paternal Grandmother   . Breast cancer Maternal Grandmother   . Skin cancer Maternal Grandmother   . Iron deficiency Mother     Social History Social History   Tobacco Use  . Smoking status: Current Some Day Smoker    Types: E-cigarettes  . Smokeless tobacco: Never Used  . Tobacco comment: vapes, not every day  Vaping Use  .  Vaping Use: Some days  . Substances: THC  Substance Use Topics  . Alcohol use: Yes    Comment: occasionally  . Drug use: No     Allergies   Patient has no known allergies.   Review of Systems Review of Systems  Constitutional: Negative.  Negative for chills, fatigue and fever.  HENT: Negative.   Respiratory: Negative for chest tightness, shortness of breath and wheezing.   Cardiovascular: Negative for chest pain and palpitations.  Gastrointestinal: Positive for abdominal pain, nausea and vomiting. Negative for abdominal distention and diarrhea.  Genitourinary: Negative.   Musculoskeletal: Negative.   Neurological: Negative.  Negative for dizziness, light-headedness and headaches.     Physical  Exam Triage Vital Signs ED Triage Vitals  Enc Vitals Group     BP 02/25/20 1356 132/85     Pulse Rate 02/25/20 1356 (!) 49     Resp 02/25/20 1356 18     Temp 02/25/20 1356 97.6 F (36.4 C)     Temp Source 02/25/20 1356 Oral     SpO2 02/25/20 1356 99 %     Weight 02/25/20 1359 205 lb (93 kg)     Height 02/25/20 1359 5\' 8"  (1.727 m)     Head Circumference --      Peak Flow --      Pain Score 02/25/20 1359 10     Pain Loc --      Pain Edu? --      Excl. in GC? --    No data found.  Updated Vital Signs BP 132/85 (BP Location: Left Arm)   Pulse (!) 49   Temp 97.6 F (36.4 C) (Oral)   Resp 18   Ht 5\' 8"  (1.727 m)   Wt 93 kg   LMP 02/06/2020 (Approximate)   SpO2 99%   BMI 31.17 kg/m   Visual Acuity Right Eye Distance:   Left Eye Distance:   Bilateral Distance:    Right Eye Near:   Left Eye Near:    Bilateral Near:     Physical Exam Vitals and nursing note reviewed.  Constitutional:      General: She is in acute distress.     Appearance: She is not ill-appearing.  Cardiovascular:     Rate and Rhythm: Normal rate and regular rhythm.     Heart sounds: Normal heart sounds.  Pulmonary:     Effort: Pulmonary effort is normal.     Breath sounds: Normal breath sounds.  Abdominal:     Palpations: Abdomen is soft.     Tenderness: There is abdominal tenderness. There is no guarding.     Comments: Epigastric tenderness with no guarding or rebound tenderness.  Bowel sounds present and normal pitch/frequency.  Neurological:     Mental Status: She is alert.     GCS: GCS eye subscore is 4. GCS verbal subscore is 5. GCS motor subscore is 6.      UC Treatments / Results  Labs (all labs ordered are listed, but only abnormal results are displayed) Labs Reviewed  CBC WITH DIFFERENTIAL/PLATELET - Abnormal; Notable for the following components:      Result Value   WBC 10.9 (*)    Neutro Abs 8.1 (*)    All other components within normal limits  URINALYSIS, COMPLETE (UACMP)  WITH MICROSCOPIC - Abnormal; Notable for the following components:   Color, Urine AMBER (*)    APPearance HAZY (*)    Specific Gravity, Urine >1.030 (*)    Bilirubin Urine  LARGE (*)    Ketones, ur 40 (*)    Protein, ur 30 (*)    Bacteria, UA FEW (*)    All other components within normal limits  COMPREHENSIVE METABOLIC PANEL - Abnormal; Notable for the following components:   Potassium 3.2 (*)    AST 11 (*)    All other components within normal limits  SARS CORONAVIRUS 2 (TAT 6-24 HRS)  LIPASE, BLOOD    EKG   Radiology No results found.  Procedures Procedures (including critical care time)  Medications Ordered in UC Medications  alum & mag hydroxide-simeth (MAALOX/MYLANTA) 200-200-20 MG/5ML suspension 30 mL (30 mLs Oral Given 02/25/20 1447)    And  lidocaine (XYLOCAINE) 2 % viscous mouth solution 15 mL (15 mLs Oral Given 02/25/20 1447)  ondansetron (ZOFRAN-ODT) disintegrating tablet 8 mg (8 mg Oral Given 02/25/20 1447)    Initial Impression / Assessment and Plan / UC Course  I have reviewed the triage vital signs and the nursing notes.  Pertinent labs & imaging results that were available during my care of the patient were reviewed by me and considered in my medical decision making (see chart for details).    1.  Acute alcoholic gastritis without hemorrhage: CBC shows a normal WBC and hemoglobin level Comprehensive metabolic panel and lipase levels are unremarkable GI cocktail x1 dose Zofran 8 mg ODT x1 dose Patient is advised to avoid alcohol intake Protonix 20 mg orally daily Carafate 1 g 3 times daily with meals Zofran ODT 4 mg every 6-8 hours as needed Return precautions given Patient advised to avoid NSAIDs. Final Clinical Impressions(s) / UC Diagnoses   Final diagnoses:  Acute alcoholic gastritis without hemorrhage     Discharge Instructions     Avoid alcohol use Avoid NSAID use Increase oral fluid intake If abdominal pain worsens please go to the  emergency department for further evaluation and possibly imaging.   ED Prescriptions    Medication Sig Dispense Auth. Provider   pantoprazole (PROTONIX) 20 MG tablet Take 1 tablet (20 mg total) by mouth daily. 30 tablet Risha Barretta, Britta Mccreedy, MD   sucralfate (CARAFATE) 1 GM/10ML suspension Take 10 mLs (1 g total) by mouth 4 (four) times daily -  with meals and at bedtime. 420 mL Lanay Zinda, Britta Mccreedy, MD   ondansetron (ZOFRAN ODT) 4 MG disintegrating tablet Take 1 tablet (4 mg total) by mouth every 8 (eight) hours as needed for nausea or vomiting. 20 tablet Doyne Ellinger, Britta Mccreedy, MD     PDMP not reviewed this encounter.   Merrilee Jansky, MD 02/25/20 1531

## 2020-02-25 NOTE — Discharge Instructions (Signed)
Avoid alcohol use Avoid NSAID use Increase oral fluid intake If abdominal pain worsens please go to the emergency department for further evaluation and possibly imaging.

## 2020-02-26 LAB — SARS CORONAVIRUS 2 (TAT 6-24 HRS): SARS Coronavirus 2: NEGATIVE

## 2021-07-03 IMAGING — US US PELVIS COMPLETE TRANSABD/TRANSVAG W DUPLEX
1 series · 13 of 25 positions shown · non-contrast
Comparison: Abdominopelvic CT same date

CLINICAL DATA: Right upper quadrant abdominal pain. Left adnexal
cyst on CT.

EXAM:
TRANSABDOMINAL AND TRANSVAGINAL ULTRASOUND OF PELVIS
DOPPLER ULTRASOUND OF OVARIES
TECHNIQUE: Both transabdominal and transvaginal ultrasound examinations of the
pelvis were performed. Transabdominal technique was performed for
global imaging of the pelvis including uterus, ovaries, adnexal
regions, and pelvic cul-de-sac.
It was necessary to proceed with endovaginal exam following the
transabdominal exam to visualize the endometrium and ovaries to
better advantage. Color and duplex Doppler ultrasound was utilized
to evaluate blood flow to the ovaries.

[Series 1: us pelvic complete w transvaginal and torsion righ · 13 of 71 slices shown]
[im 1/71]
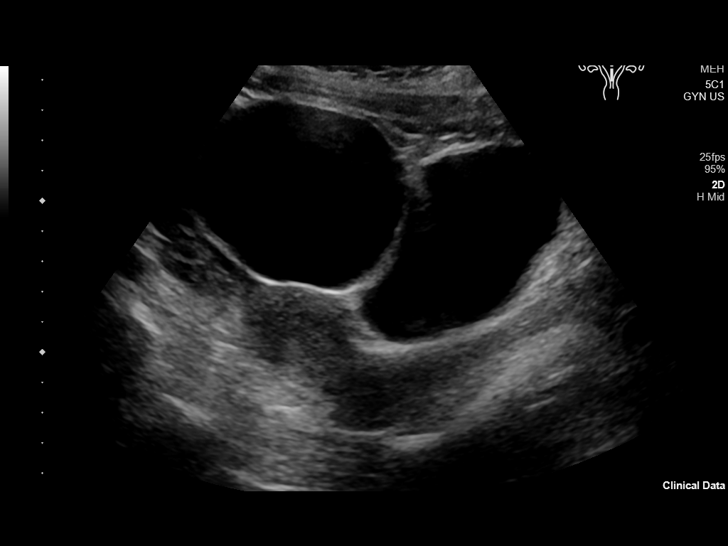
[im 6/71]
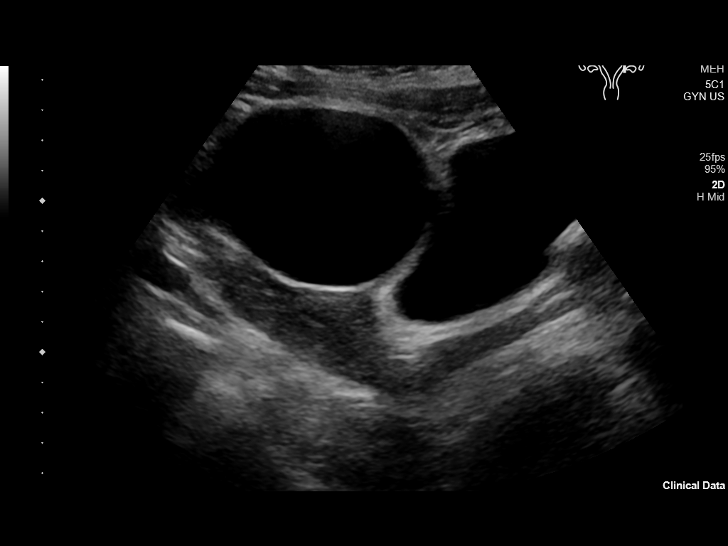
[im 12/71]
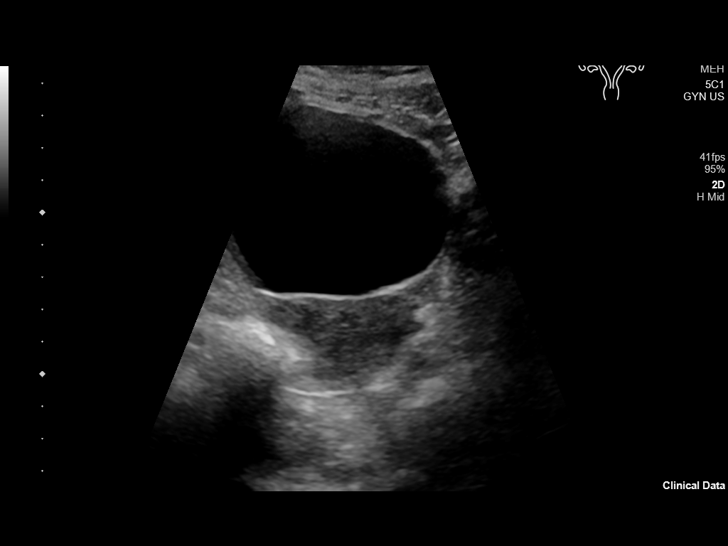
[im 18/71]
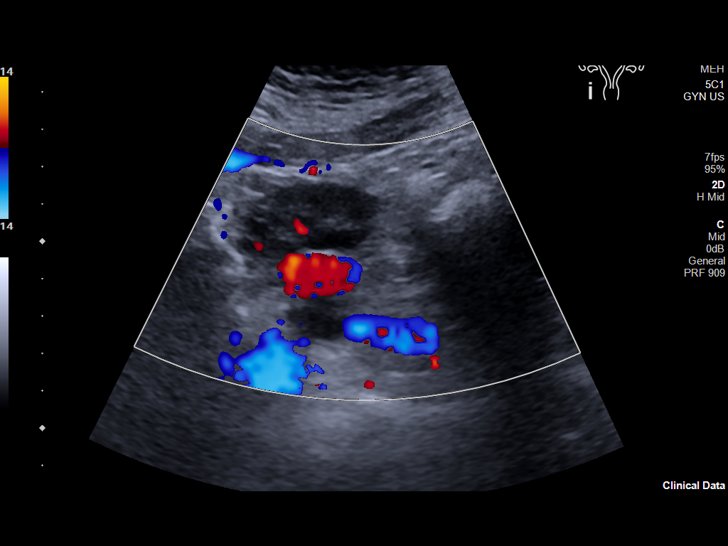
[im 24/71]
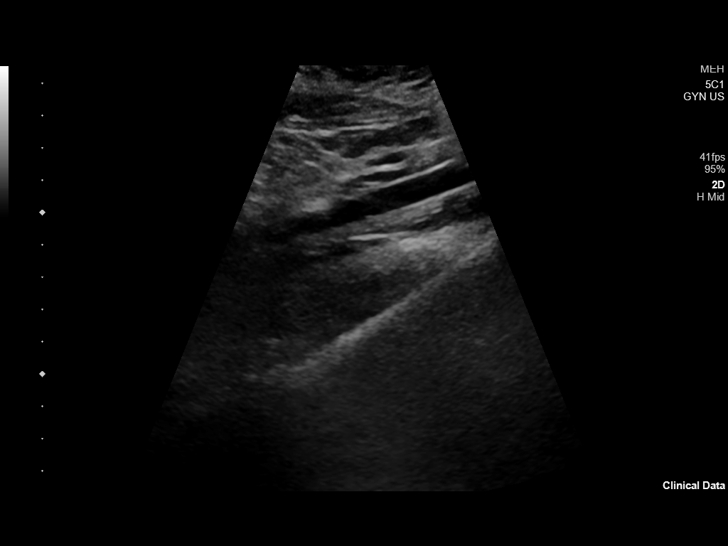
[im 30/71]
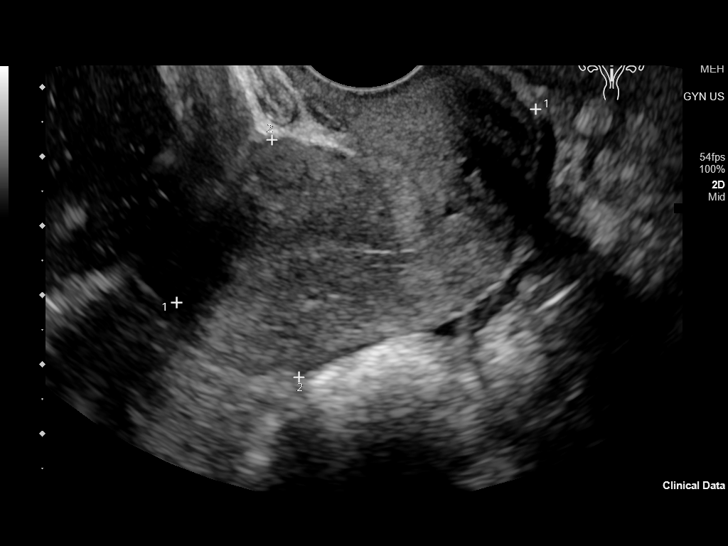
[im 36/71]
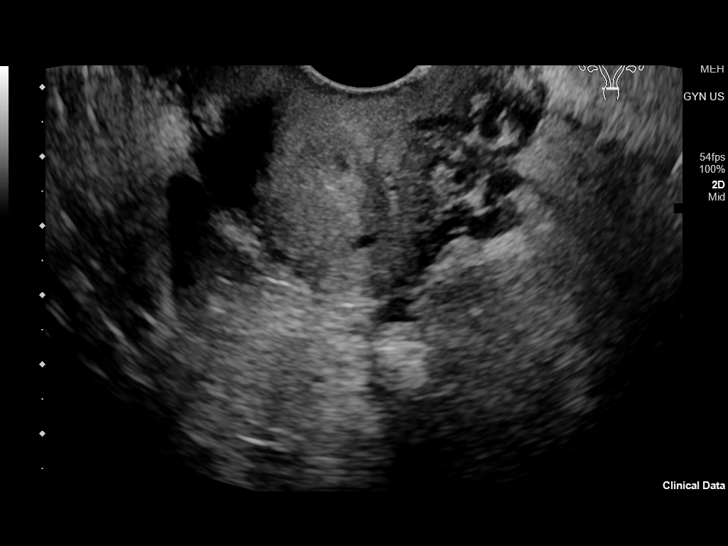
[im 41/71]
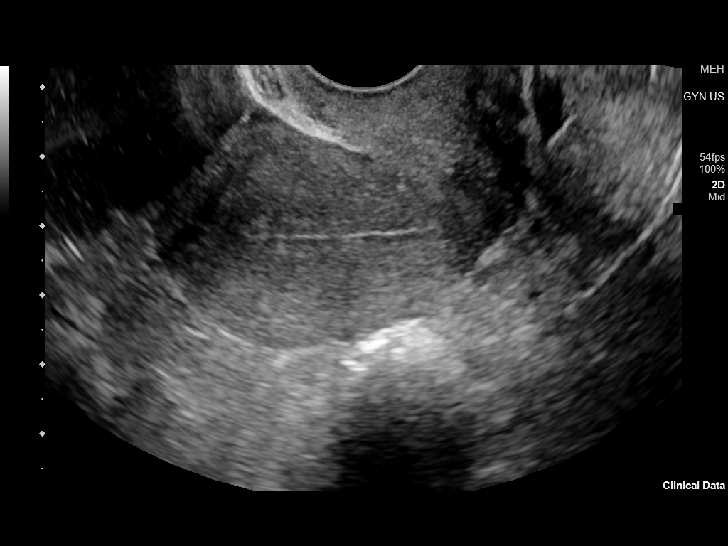
[im 47/71]
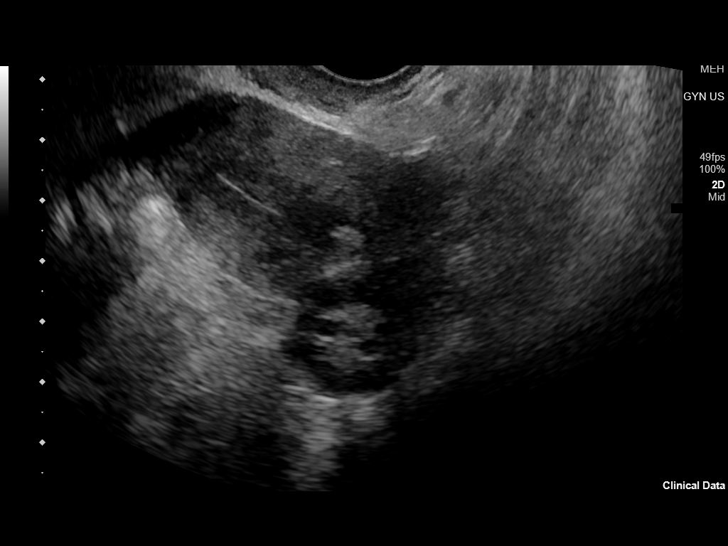
[im 53/71]
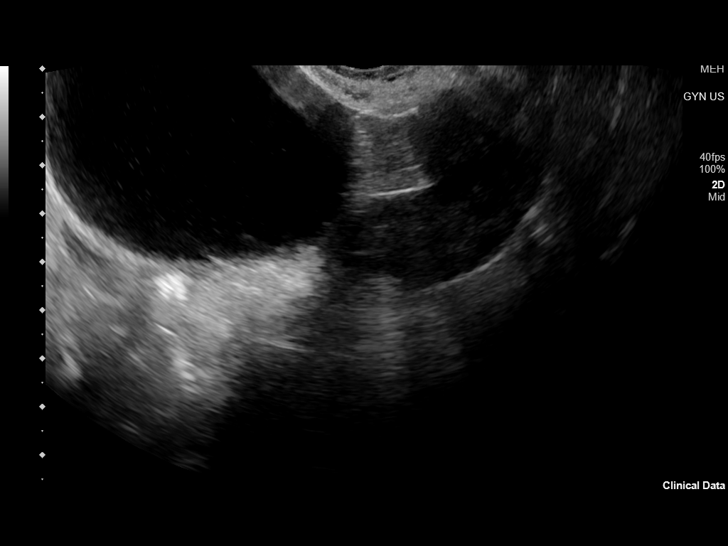
[im 59/71]
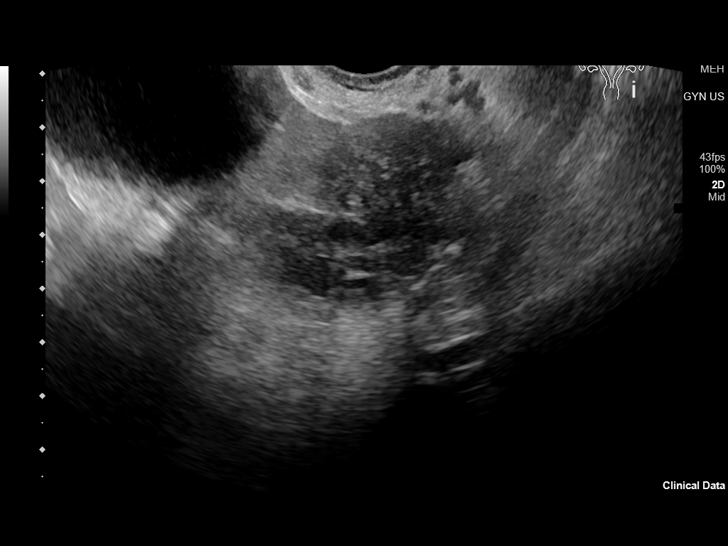
[im 65/71]
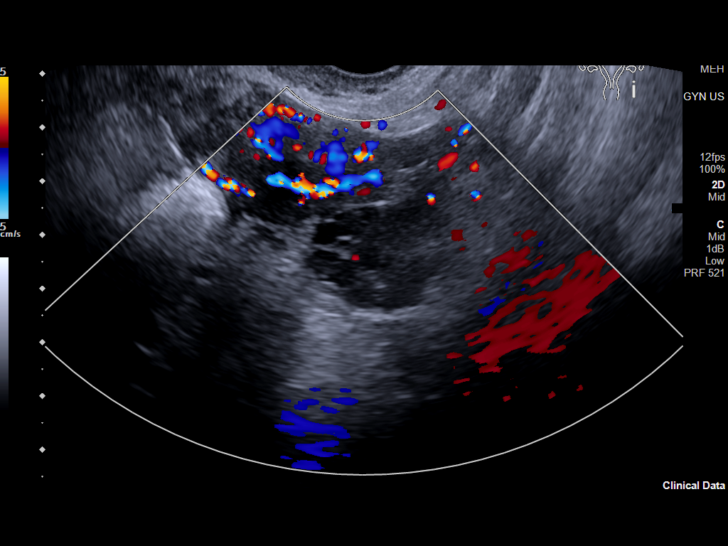
[im 71/71]
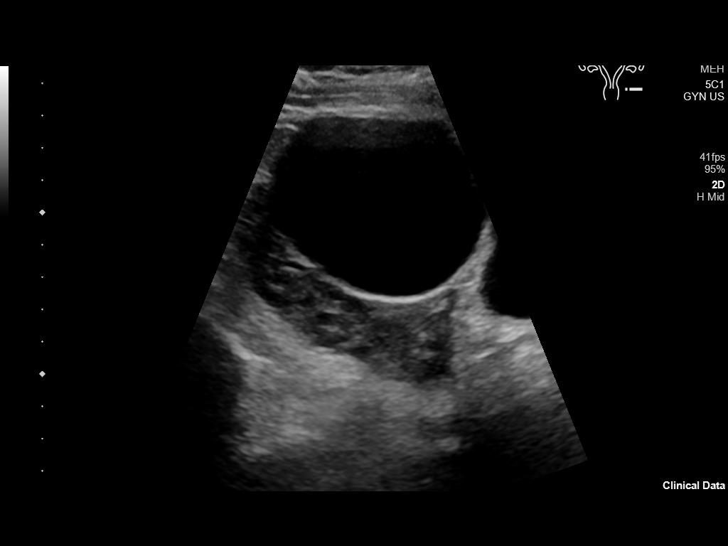

[13 of 25 positions shown; findings below may reference images not displayed]

FINDINGS: Uterus

Measurements: 7.4 x 2.9 x 4.4 cm = volume: 49.5 mL. No fibroids or
other mass visualized.

Endometrium

Thickness: 5 mm.  No focal abnormality visualized.

Right ovary

Measurements: 4.3 x 1.8 x 2.9 cm = volume: 11.8 mL. Normal
appearance/no adnexal mass. Normal blood flow with color Doppler.

Left ovary

Measurements: 4.6 x 2.2 x 1.5 cm = volume: 7.9 mL. Exophytic simple
left ovarian cyst measures 7.3 x 7.1 x 5.4 cm. This demonstrates no
septations or solid components. Normal left ovarian blood flow with
color Doppler.

Pulsed Doppler evaluation of both ovaries demonstrates normal
low-resistance arterial and venous waveforms.

Other findings

Trace free pelvic fluid, within physiologic limits.
IMPRESSION: 1. Simple left ovarian cyst, measuring up to 7.3 cm. Based on size,
recommend ultrasound follow-up in 2-6 months. This recommendation
follows the consensus statement: Management of Asymptomatic Ovarian
and Other Adnexal Cysts Imaged at US: Society of Radiologists in
2. No evidence of ovarian torsion or other acute pelvic findings.
